# Patient Record
Sex: Male | Born: 1985
Health system: Southern US, Community
[De-identification: ages and names within clinical notes are randomized; demographics above are authoritative.]

## PROBLEM LIST (undated history)

## (undated) DIAGNOSIS — E785 Hyperlipidemia, unspecified: Secondary | ICD-10-CM

## (undated) DIAGNOSIS — R030 Elevated blood-pressure reading, without diagnosis of hypertension: Secondary | ICD-10-CM

## (undated) DIAGNOSIS — J40 Bronchitis, not specified as acute or chronic: Secondary | ICD-10-CM

## (undated) DIAGNOSIS — R0789 Other chest pain: Secondary | ICD-10-CM

## (undated) DIAGNOSIS — I10 Essential (primary) hypertension: Secondary | ICD-10-CM

## (undated) DIAGNOSIS — I499 Cardiac arrhythmia, unspecified: Secondary | ICD-10-CM

## (undated) DIAGNOSIS — Z8489 Family history of other specified conditions: Secondary | ICD-10-CM

## (undated) DIAGNOSIS — J329 Chronic sinusitis, unspecified: Secondary | ICD-10-CM

## (undated) DIAGNOSIS — I4891 Unspecified atrial fibrillation: Secondary | ICD-10-CM

## (undated) HISTORY — PX: CYST EXCISION: SHX5701

## (undated) HISTORY — DX: Hyperlipidemia, unspecified: E78.5

## (undated) HISTORY — DX: Chronic sinusitis, unspecified: J32.9

## (undated) HISTORY — DX: Unspecified atrial fibrillation: I48.91

## (undated) HISTORY — PX: OTHER SURGICAL HISTORY: SHX169

## (undated) HISTORY — PX: CARDIOVERSION: SHX1299

## (undated) HISTORY — DX: Bronchitis, not specified as acute or chronic: J40

## (undated) HISTORY — DX: Elevated blood-pressure reading, without diagnosis of hypertension: R03.0

## (undated) HISTORY — DX: Other chest pain: R07.89

---
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
## 2004-05-09 ENCOUNTER — Emergency Department (HOSPITAL_COMMUNITY): Admission: EM | Admit: 2004-05-09 | Discharge: 2004-05-09 | Payer: Self-pay | Admitting: Emergency Medicine

## 2010-12-22 ENCOUNTER — Ambulatory Visit (HOSPITAL_COMMUNITY)
Admission: RE | Admit: 2010-12-22 | Discharge: 2010-12-22 | Payer: Self-pay | Source: Home / Self Care | Attending: Family Medicine | Admitting: Family Medicine

## 2011-03-29 ENCOUNTER — Ambulatory Visit (INDEPENDENT_AMBULATORY_CARE_PROVIDER_SITE_OTHER): Payer: BC Managed Care – PPO | Admitting: Internal Medicine

## 2011-03-29 ENCOUNTER — Encounter: Payer: Self-pay | Admitting: Internal Medicine

## 2011-03-29 VITALS — BP 100/60 | HR 64 | Ht 79.0 in | Wt 202.0 lb

## 2011-03-29 DIAGNOSIS — R1011 Right upper quadrant pain: Secondary | ICD-10-CM | POA: Insufficient documentation

## 2011-03-29 MED ORDER — DICYCLOMINE HCL 20 MG PO TABS: ORAL_TABLET | ORAL | Status: DC

## 2011-03-29 NOTE — Progress Notes (Addendum)
  Subjective:    Patient ID: Todd Ford, male    DOB: 1986-08-31, 25 y.o.   MRN: [id number]  HPI 25 year old white man with a 2 to three-month history of right upper quadrant pain. It began in late 2011. There is a constant pressure there, and there are times where it increases. Sometimes this occurs after eating. He notes that defecation will relieve it partially but it's always there. It does not disturb his sleep. He said some mild irregular bowel habits with every other day and now moving his bowels twice a day but this isn't necessarily out of the ordinary. There is no bleeding or unintentional weight loss. He has not had problems like this before. Perhaps some mild nausea at one point but no vomiting. His workup has included an abdominal CT scan with contrast on 12/22/2010 that is normal. A CBC, metabolic profile including LFTs was also normal. Amylase normal as well. He tried Protonix but noted increased bloating and felt worse so stopped it. No recent gastroenteritis preceding this no chronic antibiotic use or anything. He had a prostatitis treated  He is employed as an Personnel officer. He works at the [employer]. 3 caffeinated beverages a day. No alcohol. Past medical history, surgical history, allergies, medications as well as family and social history reviewed and updated in the EMR. Review of Systems Comprehensive review of systems otherwise negative except as mentioned in history of present illness.    Objective:   Physical Exam Developed well-nourished young white man in no acute distress Eyes anicteric Neck supple no thyromegaly or mass Lungs clear Heart S1-S2 no rubs or gallops Abdomen is thin soft and nontender without organomegaly mass the region nontender as well Extremities free of edema No subclavicular or cervical adenopathy Psych appropriate mood and affect Skin no acute rash noted       Assessment & Plan:  See the problem list assessment and plan underwent  upper quadrant pain

## 2011-03-29 NOTE — Assessment & Plan Note (Signed)
He's had a several month history of this as of April 2012. CT abdomen and laboratory testing including a CMP, CBC and amylase are unrevealing. The symptoms are not disabling to her improved with defecation suggesting a cut oriented problem as opposed to the liver or gallbladder problems. The constant pressure sensation suggests that as well. I suspect this is functional, IBS-like problem. No weight loss or other warning signs. He will try dicyclomine 20 mg a half to 1 tab before meals and he will call back in 2 weeks with the results of this therapeutic trial. I explained other testing such as a gallbladder ultrasound could be indicated though his pain is not really biliary in nature it seems. Don't think he needs any sort of endoscopic workup based on the available information. Repeat laboratory testing could be indicated. That will depend upon the overall course.

## 2011-03-29 NOTE — Patient Instructions (Signed)
Your prescription(s) have been sent to you pharmacy.  Call back in 2 weeks to speak with Dr. Teresita Madura nurse with an update.

## 2011-04-01 ENCOUNTER — Encounter: Payer: Self-pay | Admitting: Internal Medicine

## 2011-04-14 ENCOUNTER — Telehealth: Payer: Self-pay | Admitting: Internal Medicine

## 2011-04-14 NOTE — Telephone Encounter (Signed)
Patient reports no improvement in abdominal pain after trial of dicyclomine.  Dr Leone Payor please advise next step.

## 2011-04-15 NOTE — Telephone Encounter (Signed)
Patient advised, he states he needs to call me back

## 2011-04-15 NOTE — Telephone Encounter (Signed)
Please have him stop the dicyclomine and order an abdominal ultrasound to evalute RUQ pain 789.01

## 2011-04-15 NOTE — Telephone Encounter (Signed)
Patient advised of the appt for [place] at [facility] for 04/21/11 11:00.  He is advised to be NPO after midnight.

## 2011-04-21 ENCOUNTER — Ambulatory Visit (HOSPITAL_COMMUNITY)
Admission: RE | Admit: 2011-04-21 | Discharge: 2011-04-21 | Disposition: A | Discharge status: Home / Self Care | Payer: BC Managed Care – PPO | Source: Ambulatory Visit | Attending: Internal Medicine | Admitting: Internal Medicine

## 2011-04-21 DIAGNOSIS — R1011 Right upper quadrant pain: Secondary | ICD-10-CM | POA: Insufficient documentation

## 2011-04-21 DIAGNOSIS — R110 Nausea: Secondary | ICD-10-CM | POA: Insufficient documentation

## 2011-04-21 NOTE — Progress Notes (Signed)
Quick Note:  This is ok No worrisome findings on any studies Unless there is new problem(s) i would like him to take align 1 qd x 1 month as bloating problems can be helped by a probiotic See me in 4-6 weeks to review things Let me know if any other ?'s ______

## 2013-02-20 ENCOUNTER — Ambulatory Visit (INDEPENDENT_AMBULATORY_CARE_PROVIDER_SITE_OTHER): Payer: BC Managed Care – PPO | Admitting: Surgery

## 2013-02-20 ENCOUNTER — Encounter (INDEPENDENT_AMBULATORY_CARE_PROVIDER_SITE_OTHER): Payer: Self-pay | Admitting: Surgery

## 2013-02-20 VITALS — BP 124/66 | HR 87 | Temp 97.1°F | Resp 16 | Ht 79.0 in | Wt 208.4 lb

## 2013-02-20 DIAGNOSIS — R1903 Right lower quadrant abdominal swelling, mass and lump: Secondary | ICD-10-CM

## 2013-02-20 DIAGNOSIS — R1909 Other intra-abdominal and pelvic swelling, mass and lump: Secondary | ICD-10-CM

## 2013-02-20 NOTE — Patient Instructions (Signed)
GENERAL SURGERY: POST OP INSTRUCTIONS ° °1. DIET: Follow a light bland diet the first 24 hours after arrival home, such as soup, liquids, crackers, etc.  Be sure to include lots of fluids daily.  Avoid fast food or heavy meals as your are more likely to get nauseated.   °2. Take your usually prescribed home medications unless otherwise directed. °3. PAIN CONTROL: °a. Pain is best controlled by a usual combination of three different methods TOGETHER: °i. Ice/Heat °ii. Over the counter pain medication °iii. Prescription pain medication °b. Most patients will experience some swelling and bruising around the incisions.  Ice packs or heating pads (30-60 minutes up to 6 times a day) will help. Use ice for the first few days to help decrease swelling and bruising, then switch to heat to help relax tight/sore spots and speed recovery.  Some people prefer to use ice alone, heat alone, alternating between ice & heat.  Experiment to what works for you.  Swelling and bruising can take several weeks to resolve.   °c. It is helpful to take an over-the-counter pain medication regularly for the first few weeks.  Choose one of the following that works best for you: °i. Naproxen (Aleve, etc)  Two 220mg tabs twice a day °ii. Ibuprofen (Advil, etc) Three 200mg tabs four times a day (every meal & bedtime) °iii. Acetaminophen (Tylenol, etc) 500-650mg four times a day (every meal & bedtime) °d. A  prescription for pain medication (such as oxycodone, hydrocodone, etc) should be given to you upon discharge.  Take your pain medication as prescribed.  °i. If you are having problems/concerns with the prescription medicine (does not control pain, nausea, vomiting, rash, itching, etc), please call us [phone] to see if we need to switch you to a different pain medicine that will work better for you and/or control your side effect better. °ii. If you need a refill on your pain medication, please contact your pharmacy.  They will contact our  office to request authorization. Prescriptions will not be filled after 5 pm or on week-ends. °4. Avoid getting constipated.  Between the surgery and the pain medications, it is common to experience some constipation.  Increasing fluid intake and taking a fiber supplement (such as Metamucil, Citrucel, FiberCon, MiraLax, etc) 1-2 times a day regularly will usually help prevent this problem from occurring.  A mild laxative (prune juice, Milk of Magnesia, MiraLax, etc) should be taken according to package directions if there are no bowel movements after 48 hours.   °5. Wash / shower every day.  You may shower over the dressings as they are waterproof.  Continue to shower over incision(s) after the dressing is off. °6. Remove your waterproof bandages 5 days after surgery.  You may leave the incision open to air.  You may have skin tapes (Steri Strips) covering the incision(s).  Leave them on until one week, then remove.  You may replace a dressing/Band-Aid to cover the incision for comfort if you wish.  ° ° ° ° °7. ACTIVITIES as tolerated:   °a. You may resume regular (light) daily activities beginning the next day--such as daily self-care, walking, climbing stairs--gradually increasing activities as tolerated.  If you can walk 30 minutes without difficulty, it is safe to try more intense activity such as jogging, treadmill, bicycling, low-impact aerobics, swimming, etc. °b. Save the most intensive and strenuous activity for last such as sit-ups, heavy lifting, contact sports, etc  Refrain from any heavy lifting or straining until you   are off narcotics for pain control.   °c. DO NOT PUSH THROUGH PAIN.  Let pain be your guide: If it hurts to do something, don't do it.  Pain is your body warning you to avoid that activity for another week until the pain goes down. °d. You may drive when you are no longer taking prescription pain medication, you can comfortably wear a seatbelt, and you can safely maneuver your car and  apply brakes. °e. You may have sexual intercourse when it is comfortable.  °8. FOLLOW UP in our office °a. Please call [facility] at [phone] to set up an appointment to see your surgeon in the office for a follow-up appointment approximately 2-3 weeks after your surgery. °b. Make sure that you call for this appointment the day you arrive home to insure a convenient appointment time. °9. IF YOU HAVE DISABILITY OR FAMILY LEAVE FORMS, BRING THEM TO THE OFFICE FOR PROCESSING.  DO NOT GIVE THEM TO YOUR DOCTOR. ° ° °WHEN TO CALL US [phone]: °1. Poor pain control °2. Reactions / problems with new medications (rash/itching, nausea, etc)  °3. Fever over 101.5 F (38.5 C) °4. Worsening swelling or bruising °5. Continued bleeding from incision. °6. Increased pain, redness, or drainage from the incision °7. Difficulty breathing / swallowing ° ° The clinic staff is available to answer your questions during regular business hours (8:30am-5pm).  Please don’t hesitate to call and ask to speak to one of our nurses for clinical concerns.  ° If you have a medical emergency, go to the nearest emergency room or call 911. ° A surgeon from [facility] is always on call at the hospitals ° ° °[facility] °[address], [address], [place], [place]  [address] ? °MAIN: [phone] ? TOLL FREE: [phone] ?  °FAX [phone] °[email or web address] ° °

## 2013-02-20 NOTE — Progress Notes (Signed)
Patient ID: Todd Ford, male   DOB: [birth year], 27 y.o.   MRN: [id number]  Chief Complaint  Patient presents with  . New Evaluation    eval RIH    HPI Todd Ford is a 27 y.o. male.  Patient sent at the request of Dr.Luking for a 1 cm mass located in his right groin above the inguinal canal along the belt line. He was noticed 3 months ago. It is tender and causing discomfort there is pain to cross this. No redness or drainage. HPI  History reviewed. No pertinent past medical history.  Past Surgical History  Procedure Laterality Date  . Arm surgery      right, bone spur    Family History  Problem Relation Age of Onset  . Lung cancer Paternal Grandfather     Social History History  Substance Use Topics  . Smoking status: Never Smoker   . Smokeless tobacco: Never Used  . Alcohol Use: Yes     Comment: rare    Allergies  Allergen Reactions  . Neosporin (Neomycin-Polymyxin-Gramicidin)     Wound never heals it gets infected  . Penicillins Rash    Current Outpatient Prescriptions  Medication Sig Dispense Refill  . loratadine (CLARITIN) 10 MG tablet Take 10 mg by mouth daily.        . Multiple Vitamin (ONE-A-DAY MENS PO) Take 1 tablet by mouth daily.         No current facility-administered medications for this visit.    Review of Systems Review of Systems  Constitutional: Negative for fever, chills and unexpected weight change.  HENT: Negative for hearing loss, congestion, sore throat, trouble swallowing and voice change.   Eyes: Negative for visual disturbance.  Respiratory: Negative for cough and wheezing.   Cardiovascular: Negative for chest pain, palpitations and leg swelling.  Gastrointestinal: Negative for nausea, vomiting, abdominal pain, diarrhea, constipation, blood in stool, abdominal distention, anal bleeding and rectal pain.  Genitourinary: Negative for hematuria and difficulty urinating.  Musculoskeletal: Negative for arthralgias.  Skin:  Negative for rash and wound.  Neurological: Negative for seizures, syncope, weakness and headaches.  Hematological: Negative for adenopathy. Does not bruise/bleed easily.  Psychiatric/Behavioral: Negative for confusion.    Blood pressure 124/66, pulse 87, temperature 97.1 F (36.2 C), temperature source Temporal, resp. rate 16, height 6\' 7"  (2.007 m), weight 208 lb 6.4 oz (94.53 kg), SpO2 98.00%.  Physical Exam Physical Exam  Constitutional: He appears well-developed and well-nourished.  HENT:  Head: Normocephalic and atraumatic.  Eyes: EOM are normal. Pupils are equal, round, and reactive to light.  Neck: Normal range of motion.  Cardiovascular: Normal rate and regular rhythm.   Pulmonary/Chest: Effort normal and breath sounds normal.  Abdominal: There is no tenderness. No hernia. Hernia confirmed negative in the right inguinal area and confirmed negative in the left inguinal area.    Musculoskeletal: Normal range of motion.  Neurological: He is alert.  Skin: Skin is warm and dry.  Psychiatric: He has a normal mood and affect. His behavior is normal. Judgment and thought content normal.      Assessment    1 cm right groin mass at the beltline    Plan    This is causing pain in the patient wishes to have it excised.The procedure has been discussed with the patient.  Alternative therapies have been discussed with the patient.  Operative risks include bleeding,  Infection,  Organ injury,  Nerve injury,  Blood vessel injury,  DVT,  Pulmonary embolism,  Death,  And possible reoperation.  Medical management risks include worsening of present situation.  The success of the procedure is 50 -90 % at treating patients symptoms.  The patient understands and agrees to proceed.       Jarvin Ogren A. 02/20/2013, 10:15 AM

## 2013-03-08 ENCOUNTER — Other Ambulatory Visit (INDEPENDENT_AMBULATORY_CARE_PROVIDER_SITE_OTHER): Payer: Self-pay | Admitting: Surgery

## 2013-03-08 DIAGNOSIS — D1739 Benign lipomatous neoplasm of skin and subcutaneous tissue of other sites: Secondary | ICD-10-CM

## 2013-03-26 ENCOUNTER — Ambulatory Visit (INDEPENDENT_AMBULATORY_CARE_PROVIDER_SITE_OTHER): Payer: BC Managed Care – PPO | Admitting: Surgery

## 2013-03-26 ENCOUNTER — Encounter (INDEPENDENT_AMBULATORY_CARE_PROVIDER_SITE_OTHER): Payer: Self-pay | Admitting: Surgery

## 2013-03-26 VITALS — BP 128/72 | HR 83 | Temp 97.3°F | Resp 18 | Ht 79.0 in | Wt 203.4 lb

## 2013-03-26 DIAGNOSIS — Z9889 Other specified postprocedural states: Secondary | ICD-10-CM

## 2013-03-26 NOTE — Progress Notes (Signed)
Patient returns after excision of right lower quadrant mass from abdominal wall. This was an angiolipoma.  Exam: Right lower quadrant incision clean dry and intact.  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 Resume full activity.

## 2013-03-26 NOTE — Patient Instructions (Signed)
Return as needed

## 2013-08-18 ENCOUNTER — Encounter: Payer: Self-pay | Admitting: *Deleted

## 2013-08-23 ENCOUNTER — Encounter: Payer: Self-pay | Admitting: Family Medicine

## 2013-08-23 ENCOUNTER — Ambulatory Visit (INDEPENDENT_AMBULATORY_CARE_PROVIDER_SITE_OTHER): Payer: BC Managed Care – PPO | Admitting: Family Medicine

## 2013-08-23 VITALS — BP 126/70 | Ht 79.0 in | Wt 207.0 lb

## 2013-08-23 DIAGNOSIS — M76892 Other specified enthesopathies of left lower limb, excluding foot: Secondary | ICD-10-CM

## 2013-08-23 DIAGNOSIS — M6580 Other synovitis and tenosynovitis, unspecified site: Secondary | ICD-10-CM

## 2013-08-23 MED ORDER — MELOXICAM 15 MG PO TABS: 15.0000 mg | ORAL_TABLET | Freq: Every day | ORAL | Status: DC

## 2013-08-23 NOTE — Progress Notes (Signed)
  Subjective:    Patient ID: Todd Ford, male    DOB: [birth year], 27 y.o.   MRN: [id number]  Knee Pain  The incident occurred more than 1 week ago. There was no injury mechanism. The pain is present in the left knee. The quality of the pain is described as aching. The pain is at a severity of 10/10. The pain is severe. The pain has been intermittent since onset. He reports no foreign bodies present. Nothing aggravates the symptoms. He has tried NSAIDs and acetaminophen for the symptoms. The treatment provided no relief.  Hip Pain  The incident occurred more than 1 week ago. There was no injury mechanism. The pain is present in the right hip. The quality of the pain is described as aching. The pain is at a severity of 5/10. The pain is moderate. The pain has been intermittent since onset. He reports no foreign bodies present. Nothing aggravates the symptoms. He has tried acetaminophen and NSAIDs for the symptoms. The treatment provided no relief.   During 15k, pain worse going downhill, did not lock Patient not doing a proper job of training and exercising for such a long event. I advised the patient to gradually work his way up on the eyelids to help. Review of Systems    see above Objective:   Physical Exam Low back nontender he has tight hamstrings. Some limited range of motion with the hips. Knee ligaments stable no sign of any cartilage damage. Thigh and calf are normal.       Assessment & Plan:  Orthopedic pain related to overuse-proper training discuss icing after activity Mobic when necessary 15 mg 1 per day, followup if ongoing 20 minutes spent with patient

## 2013-11-06 ENCOUNTER — Ambulatory Visit: Payer: BC Managed Care – PPO | Admitting: Family Medicine

## 2013-11-07 ENCOUNTER — Ambulatory Visit (INDEPENDENT_AMBULATORY_CARE_PROVIDER_SITE_OTHER): Payer: BC Managed Care – PPO | Admitting: Family Medicine

## 2013-11-07 ENCOUNTER — Encounter: Payer: Self-pay | Admitting: Family Medicine

## 2013-11-07 VITALS — BP 100/70 | Temp 98.2°F | Ht 79.0 in | Wt 203.5 lb

## 2013-11-07 DIAGNOSIS — J329 Chronic sinusitis, unspecified: Secondary | ICD-10-CM

## 2013-11-07 MED ORDER — LEVOFLOXACIN 500 MG PO TABS: 500.0000 mg | ORAL_TABLET | Freq: Every day | ORAL | Status: AC

## 2013-11-07 NOTE — Progress Notes (Signed)
   Subjective:    Patient ID: Todd Ford, male    DOB: [birth year], 27 y.o.   MRN: [id number]  Sinusitis This is a new problem. The current episode started in the past 7 days. The problem has been gradually worsening since onset. Maximum temperature: 99. His pain is at a severity of 6/10. The pain is moderate. Associated symptoms include congestion, coughing, sinus pressure and a sore throat. Past treatments include nothing. The treatment provided no relief.   Nose running for a few weeks. Just runny. Last four days clogged up, congested  Some coughing, no headach, prod cough  Nonsmoker  Low gr fever   Moving a little into chest, gets some fall alergiw   Review of Systems  HENT: Positive for congestion, sinus pressure and sore throat.   Respiratory: Positive for cough.    No vomiting no diarrhea no rash ROS otherwise negative    Objective:   Physical Exam  Alert hydration good. HEENT moderate nasal congestion. Frontal tenderness. Pharynx normal. Neck supple. Lungs clear. Heart regular in rhythm.     Assessment & Plan:  Impression 1 rhinosinusitis discussed plan Levaquin daily 10 days. Symptomatic care discussed. WSL

## 2013-11-07 NOTE — Patient Instructions (Signed)
Consider adding otc afrin spray at night next 4 or 5 days

## 2013-12-28 ENCOUNTER — Encounter: Payer: Self-pay | Admitting: Family Medicine

## 2013-12-28 ENCOUNTER — Ambulatory Visit (INDEPENDENT_AMBULATORY_CARE_PROVIDER_SITE_OTHER): Payer: BC Managed Care – PPO | Admitting: Family Medicine

## 2013-12-28 VITALS — BP 128/82 | Ht 79.0 in | Wt 206.6 lb

## 2013-12-28 DIAGNOSIS — J019 Acute sinusitis, unspecified: Secondary | ICD-10-CM

## 2013-12-28 MED ORDER — LEVOFLOXACIN 500 MG PO TABS: 500.0000 mg | ORAL_TABLET | Freq: Every day | ORAL | Status: DC

## 2013-12-28 NOTE — Progress Notes (Signed)
   Subjective:    Patient ID: Todd Ford, male    DOB: 1985/12/12, 28 y.o.   MRN: [id number]  Sinus Problem This is a new problem. The current episode started in the past 7 days. Associated symptoms include chills, congestion, coughing and headaches. Pertinent negatives include no ear pain.  started Monday this week. On Tues felt worse, felt DOE T W TH also throat pain ans sinus pain Myalgias- a little  Last week with sweats and chills at night.   PMH benign  Review of Systems  Constitutional: Positive for chills. Negative for fever and activity change.  HENT: Positive for congestion and rhinorrhea. Negative for ear pain.   Eyes: Negative for discharge.  Respiratory: Positive for cough. Negative for wheezing.   Cardiovascular: Negative for chest pain.  Gastrointestinal: Positive for nausea and diarrhea. Negative for abdominal pain.  Neurological: Positive for headaches.       Objective:   Physical Exam  Nursing note and vitals reviewed. Constitutional: He appears well-developed.  HENT:  Head: Normocephalic.  Mouth/Throat: Oropharynx is clear and moist. No oropharyngeal exudate.  Neck: Normal range of motion.  Cardiovascular: Normal rate, regular rhythm and normal heart sounds.   No murmur heard. Pulmonary/Chest: Effort normal and breath sounds normal. He has no wheezes.  Lymphadenopathy:    He has no cervical adenopathy.  Neurological: He exhibits normal muscle tone.  Skin: Skin is warm and dry.          Assessment & Plan:  Viral syndrome started last week progressed into a mild sinus infection antibiotics prescribed warning signs discussed if progressive troubles notify us.

## 2014-02-27 ENCOUNTER — Ambulatory Visit: Payer: BC Managed Care – PPO | Admitting: Family Medicine

## 2014-08-20 ENCOUNTER — Ambulatory Visit (INDEPENDENT_AMBULATORY_CARE_PROVIDER_SITE_OTHER): Payer: BC Managed Care – PPO | Admitting: Family Medicine

## 2014-08-20 ENCOUNTER — Encounter: Payer: Self-pay | Admitting: Family Medicine

## 2014-08-20 VITALS — BP 100/70 | Ht 79.0 in | Wt 200.4 lb

## 2014-08-20 DIAGNOSIS — R61 Generalized hyperhidrosis: Secondary | ICD-10-CM

## 2014-08-20 LAB — BASIC METABOLIC PANEL
BUN: 12 mg/dL (ref 6–23)
CO2: 32 mEq/L (ref 19–32)
Calcium: 9.7 mg/dL (ref 8.4–10.5)
Chloride: 101 mEq/L (ref 96–112)
Creat: 0.93 mg/dL (ref 0.50–1.35)
Glucose, Bld: 82 mg/dL (ref 70–99)
Potassium: 4 mEq/L (ref 3.5–5.3)
Sodium: 138 mEq/L (ref 135–145)

## 2014-08-20 LAB — CBC WITH DIFFERENTIAL/PLATELET
Basophils Absolute: 0.1 10*3/uL (ref 0.0–0.1)
Basophils Relative: 1 % (ref 0–1)
Eosinophils Absolute: 0.3 10*3/uL (ref 0.0–0.7)
Eosinophils Relative: 5 % (ref 0–5)
HCT: 41.1 % (ref 39.0–52.0)
Hemoglobin: 14.7 g/dL (ref 13.0–17.0)
Lymphocytes Relative: 34 % (ref 12–46)
Lymphs Abs: 1.8 10*3/uL (ref 0.7–4.0)
MCH: 30.8 pg (ref 26.0–34.0)
MCHC: 35.8 g/dL (ref 30.0–36.0)
MCV: 86.2 fL (ref 78.0–100.0)
Monocytes Absolute: 0.6 10*3/uL (ref 0.1–1.0)
Monocytes Relative: 12 % (ref 3–12)
Neutro Abs: 2.5 10*3/uL (ref 1.7–7.7)
Neutrophils Relative %: 48 % (ref 43–77)
Platelets: 234 10*3/uL (ref 150–400)
RBC: 4.77 MIL/uL (ref 4.22–5.81)
RDW: 13.6 % (ref 11.5–15.5)
WBC: 5.3 10*3/uL (ref 4.0–10.5)

## 2014-08-20 LAB — HEPATIC FUNCTION PANEL
ALT: 14 U/L (ref 0–53)
AST: 17 U/L (ref 0–37)
Albumin: 4.5 g/dL (ref 3.5–5.2)
Alkaline Phosphatase: 69 U/L (ref 39–117)
Bilirubin, Direct: 0.1 mg/dL (ref 0.0–0.3)
Indirect Bilirubin: 0.5 mg/dL (ref 0.2–1.2)
Total Bilirubin: 0.6 mg/dL (ref 0.2–1.2)
Total Protein: 7.2 g/dL (ref 6.0–8.3)

## 2014-08-20 LAB — SEDIMENTATION RATE: Sed Rate: 1 mm/hr (ref 0–16)

## 2014-08-20 MED ORDER — LORAZEPAM 1 MG PO TABS: ORAL_TABLET | ORAL | Status: DC

## 2014-08-20 NOTE — Patient Instructions (Signed)
Please keep a journal  Of symptoms and temps. Please send it to me in 3 to 4 weeks and I will call you.

## 2014-08-20 NOTE — Progress Notes (Signed)
   Subjective:    Patient ID: Todd Ford, male    DOB: Apr 20, 1986, 28 y.o.   MRN: [id number]  HPI Patient is here today because he has not been able to sleep at night and having very bad night sweats. This has been present for 1 week now.  Patient states that he has had sinus trouble for over 1 month now. Bloody congestion, dryness noted.   Patient states he has no other concerns at this time.  Unfortunately his job recently he was told they will not have a job after one year from now Mrs. cause significant stress and this is when all these and then started  Review of Systems  Constitutional: Negative for fever, activity change, appetite change and fatigue.  HENT: Positive for congestion and rhinorrhea. Negative for ear pain.   Eyes: Negative for discharge.  Respiratory: Positive for cough. Negative for wheezing.   Cardiovascular: Negative for chest pain.  Gastrointestinal: Negative for abdominal pain, diarrhea and blood in stool.  Endocrine: Negative for polydipsia and polyphagia.  Genitourinary: Negative for hematuria.  Skin: Negative for color change.  Neurological: Negative for weakness.  Hematological: Negative for adenopathy.  Psychiatric/Behavioral: Positive for sleep disturbance. Negative for confusion.       Objective:   Physical Exam  Vitals reviewed. Constitutional: He appears well-nourished. No distress.  Cardiovascular: Normal rate, regular rhythm and normal heart sounds.   No murmur heard. Pulmonary/Chest: Effort normal and breath sounds normal. No respiratory distress.  Musculoskeletal: He exhibits no edema.  Lymphadenopathy:    He has no cervical adenopathy.  Neurological: He is alert.  Psychiatric: His behavior is normal.   No masses were noted, no lymphadenopathy Small anal tear No abnl skin lesions       Assessment & Plan:  Night sweats Labs Journal Lab work was ordered. Await the findings of these. I do not feel the patient needs x-ray her  chest CT or abdominal CT currently but I do highly recommend that he bring his journal in a few weeks' time if he is having ongoing night sweats he will need these intervention  Ativan may be used at nighttime when he has insomnia. Under a lot of stress denies being depressed denies being suicidal

## 2014-11-01 ENCOUNTER — Encounter: Payer: Self-pay | Admitting: Family Medicine

## 2014-11-01 ENCOUNTER — Ambulatory Visit (INDEPENDENT_AMBULATORY_CARE_PROVIDER_SITE_OTHER): Payer: BC Managed Care – PPO | Admitting: Family Medicine

## 2014-11-01 VITALS — BP 118/74 | Temp 98.4°F | Ht 79.0 in | Wt 215.0 lb

## 2014-11-01 DIAGNOSIS — J329 Chronic sinusitis, unspecified: Secondary | ICD-10-CM

## 2014-11-01 MED ORDER — CLARITHROMYCIN 500 MG PO TABS: 500.0000 mg | ORAL_TABLET | Freq: Two times a day (BID) | ORAL | Status: AC

## 2014-11-01 NOTE — Progress Notes (Signed)
   Subjective:    Patient ID: Todd Ford, male    DOB: 07-Dec-1985, 28 y.o.   MRN: [id number]  Cough This is a new problem. Episode onset: Monday. The problem has been gradually worsening. The cough is productive of sputum. Associated symptoms include nasal congestion, postnasal drip and a sore throat. The symptoms are aggravated by lying down. He has tried OTC cough suppressant (Mucinex) for the symptoms. The treatment provided mild relief.    Stuffy and drainag trouble breat  Into chest does not smoke  Pos prod cough  Gunky at times  Ears doable  Review of Systems  HENT: Positive for postnasal drip and sore throat.   Respiratory: Positive for cough.    no vomiting no diarrhea     Objective:   Physical Exam   Alert hydration good. Vitals stable. No acute distress. Frontal maxillary fullness congestion. Stuffiness and tenderness. Pharynx normal neck supple lungs clear. Heart regular in rhythm.     Assessment & Plan:  Impression post viral rhinosinusitis plan antibiotics prescribed. Symptomatic care discussed. Warning signs discussed. WSL

## 2016-04-01 ENCOUNTER — Ambulatory Visit (INDEPENDENT_AMBULATORY_CARE_PROVIDER_SITE_OTHER): Payer: BLUE CROSS/BLUE SHIELD | Admitting: Family Medicine

## 2016-04-01 ENCOUNTER — Encounter: Payer: Self-pay | Admitting: Family Medicine

## 2016-04-01 VITALS — BP 120/76 | Temp 97.8°F | Ht 79.0 in | Wt 221.2 lb

## 2016-04-01 DIAGNOSIS — R229 Localized swelling, mass and lump, unspecified: Secondary | ICD-10-CM | POA: Diagnosis not present

## 2016-04-01 DIAGNOSIS — R222 Localized swelling, mass and lump, trunk: Secondary | ICD-10-CM

## 2016-04-01 MED ORDER — DOXYCYCLINE HYCLATE 100 MG PO TABS: 100.0000 mg | ORAL_TABLET | Freq: Two times a day (BID) | ORAL | Status: DC

## 2016-04-01 NOTE — Progress Notes (Signed)
   Subjective:    Patient ID: Todd Ford, male    DOB: 1986-02-16, 30 y.o.   MRN: [id number]  HPI Patient is here today because he has a swollen painful spot on his chest. Onset about 1 month ago. Treatments tried: none.   He noticed this about a month ago with a swollen tender area then he states that the swelling seemed to go down low bit but he is been left with what appears to be a growth underneath the nipple with the low but a firmness to it some soreness and pain as well no redness or drainage. No fever chills or sweats. Patient has no other concerns at this time.  Review of Systems    see above Objective:   Physical Exam Lungs are clear no crackles chest wall there is some tenderness in the left nipple region no firmness but there does appear to be a nodule underneath the nipple. 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 Possibility patient may need removal of this to make sure he is not developing into a cancer situation. I did go ahead and treat with a round of antibiotics just encases could be infection related

## 2016-04-01 NOTE — Addendum Note (Signed)
Addended by: Carmelina Noun on: 04/01/2016 01:59 PM   Modules accepted: Orders

## 2016-04-16 ENCOUNTER — Ambulatory Visit
Admission: RE | Admit: 2016-04-16 | Discharge: 2016-04-16 | Disposition: A | Discharge status: Home / Self Care | Payer: BLUE CROSS/BLUE SHIELD | Source: Ambulatory Visit | Attending: General Surgery | Admitting: General Surgery

## 2016-04-16 ENCOUNTER — Other Ambulatory Visit: Payer: Self-pay | Admitting: General Surgery

## 2016-04-16 DIAGNOSIS — N62 Hypertrophy of breast: Secondary | ICD-10-CM | POA: Diagnosis not present

## 2016-04-16 DIAGNOSIS — N644 Mastodynia: Secondary | ICD-10-CM | POA: Diagnosis not present

## 2016-04-16 DIAGNOSIS — D171 Benign lipomatous neoplasm of skin and subcutaneous tissue of trunk: Secondary | ICD-10-CM | POA: Diagnosis not present

## 2016-09-03 ENCOUNTER — Encounter: Payer: Self-pay | Admitting: Nurse Practitioner

## 2016-09-03 ENCOUNTER — Ambulatory Visit (INDEPENDENT_AMBULATORY_CARE_PROVIDER_SITE_OTHER): Payer: BLUE CROSS/BLUE SHIELD | Admitting: Nurse Practitioner

## 2016-09-03 VITALS — BP 124/82 | Temp 98.0°F | Ht 79.0 in | Wt 222.4 lb

## 2016-09-03 DIAGNOSIS — J329 Chronic sinusitis, unspecified: Secondary | ICD-10-CM | POA: Diagnosis not present

## 2016-09-03 DIAGNOSIS — Z79899 Other long term (current) drug therapy: Secondary | ICD-10-CM | POA: Diagnosis not present

## 2016-09-03 DIAGNOSIS — Z13220 Encounter for screening for lipoid disorders: Secondary | ICD-10-CM

## 2016-09-03 MED ORDER — AZITHROMYCIN 250 MG PO TABS: ORAL_TABLET | ORAL | 0 refills | Status: DC

## 2016-09-03 NOTE — Patient Instructions (Signed)
Nasacort, Flonase or Rhinocort as directed daily

## 2016-09-04 ENCOUNTER — Encounter: Payer: Self-pay | Admitting: Nurse Practitioner

## 2016-09-04 NOTE — Progress Notes (Signed)
Subjective:  Presents for c/o pressure around the head with minimal headache. States he feels like "water is feeling his head for the past few days. Off-and-on mild dizziness occurring with sudden movement. Slight sore throat. Had some clear liquid draining from left ear yesterday, this has resolved. No ear pain. No fever. No cough. Right-sided ear pressure for the past 2 days. No numbness or weakness of the face arms or legs. No difficulty speaking or swallowing. No visual changes.  Objective:   BP 124/82   Temp 98 F (36.7 C) (Oral)   Ht 6\' 7"  (2.007 m)   Wt 222 lb 6 oz (100.9 kg)   BMI 25.05 kg/m  NAD. Alert, oriented. Right TM TM retracted with mild injection. Left TM minimal clear effusion. Nasal mucosa pale and boggy. Pharynx injected with green PND noted. Neck supple with mild soft anterior adenopathy. Lungs clear. Heart regular rate rhythm. Pupils equal and reactive to light. EOMs intact without nystagmus. Point-to-point localization normal limit. Reflexes normal limit. Romberg negative.  Assessment: Rhinosinusitis  Screening cholesterol level - Plan: Lipid panel  High risk medication use - Plan: Hepatic function panel, Basic metabolic panel  Plan:  Meds ordered this encounter  Medications  . azithromycin (ZITHROMAX Z-PAK) 250 MG tablet    Sig: Take 2 tablets (500 mg) on  Day 1,  followed by 1 tablet (250 mg) once daily on Days 2 through 5.    Dispense:  6 each    Refill:  0    Order Specific Question:   Supervising Provider    Answer:   Mikey Kirschner [[id number]]   OTC antihistamine and steroid nasal spray. Mild vertigo most likely related to in her ear dysfunction. Warning signs reviewed. Call back if symptoms worsen or persist. Routine lab work ordered today for worksite health promotion.

## 2016-09-17 ENCOUNTER — Encounter: Payer: Self-pay | Admitting: Family Medicine

## 2016-09-17 ENCOUNTER — Ambulatory Visit (INDEPENDENT_AMBULATORY_CARE_PROVIDER_SITE_OTHER): Payer: BLUE CROSS/BLUE SHIELD | Admitting: Family Medicine

## 2016-09-17 VITALS — BP 110/78 | Ht 79.0 in | Wt 222.4 lb

## 2016-09-17 DIAGNOSIS — Z79899 Other long term (current) drug therapy: Secondary | ICD-10-CM | POA: Diagnosis not present

## 2016-09-17 DIAGNOSIS — Z13220 Encounter for screening for lipoid disorders: Secondary | ICD-10-CM | POA: Diagnosis not present

## 2016-09-17 DIAGNOSIS — Z0000 Encounter for general adult medical examination without abnormal findings: Secondary | ICD-10-CM | POA: Diagnosis not present

## 2016-09-17 NOTE — Progress Notes (Signed)
   Subjective:    Patient ID: Todd Ford, male    DOB: [birth year], 30 y.o.   MRN: [id number]  HPI The patient comes in today for a wellness visit.    A review of their health history was completed.  A review of medications was also completed.  Any needed refills; N/A  Eating habits: good  Falls/  MVA accidents in past few months: none  Regular exercise: good  Specialist pt sees on regular basis: none  Preventative health issues were discussed.   Additional concerns: none   Review of Systems  Constitutional: Negative for activity change, appetite change and fever.  HENT: Negative for congestion and rhinorrhea.   Eyes: Negative for discharge.  Respiratory: Negative for cough and wheezing.   Cardiovascular: Negative for chest pain.  Gastrointestinal: Negative for abdominal pain, blood in stool and vomiting.  Genitourinary: Negative for difficulty urinating and frequency.  Musculoskeletal: Negative for neck pain.  Skin: Negative for rash.  Allergic/Immunologic: Negative for environmental allergies and food allergies.  Neurological: Negative for weakness and headaches.  Psychiatric/Behavioral: Negative for agitation.       Objective:   Physical Exam  Constitutional: He appears well-developed and well-nourished.  HENT:  Head: Normocephalic and atraumatic.  Right Ear: External ear normal.  Left Ear: External ear normal.  Nose: Nose normal.  Mouth/Throat: Oropharynx is clear and moist.  Eyes: EOM are normal. Pupils are equal, round, and reactive to light.  Neck: Normal range of motion. Neck supple. No thyromegaly present.  Cardiovascular: Normal rate, regular rhythm and normal heart sounds.   No murmur heard. Pulmonary/Chest: Effort normal and breath sounds normal. No respiratory distress. He has no wheezes.  Abdominal: Soft. Bowel sounds are normal. He exhibits no distension and no mass. There is no tenderness.  Genitourinary: Penis normal.  Musculoskeletal:  Normal range of motion. He exhibits no edema.  Lymphadenopathy:    He has no cervical adenopathy.  Neurological: He is alert. He exhibits normal muscle tone.  Skin: Skin is warm and dry. No erythema.  Psychiatric: He has a normal mood and affect. His behavior is normal. Judgment normal.          Assessment & Plan:  Adult wellness-complete.wellness physical was conducted today. Importance of diet and exercise were discussed in detail. In addition to this a discussion regarding safety was also covered. We also reviewed over immunizations and gave recommendations regarding current immunization needed for age. In addition to this additional areas were also touched on including: Preventative health exams needed: Colonoscopy Colonoscopy not indicated currently Safety dietary discussed in detail patient does exercise on a regular basis does not smoke or drink.  Patient was advised yearly wellness exam

## 2016-09-18 LAB — BASIC METABOLIC PANEL
BUN/Creatinine Ratio: 12 (ref 9–20)
BUN: 12 mg/dL (ref 6–20)
CO2: 28 mmol/L (ref 18–29)
Calcium: 9.5 mg/dL (ref 8.7–10.2)
Chloride: 100 mmol/L (ref 96–106)
Creatinine, Ser: 0.97 mg/dL (ref 0.76–1.27)
GFR calc Af Amer: 119 mL/min/{1.73_m2} (ref >59)
GFR calc non Af Amer: 103 mL/min/{1.73_m2} (ref >59)
Glucose: 88 mg/dL (ref 65–99)
Potassium: 4.5 mmol/L (ref 3.5–5.2)
Sodium: 142 mmol/L (ref 134–144)

## 2016-09-18 LAB — HEPATIC FUNCTION PANEL
ALT: 42 IU/L (ref 0–44)
AST: 28 IU/L (ref 0–40)
Albumin: 4.6 g/dL (ref 3.5–5.5)
Alkaline Phosphatase: 78 IU/L (ref 39–117)
Bilirubin Total: 0.7 mg/dL (ref 0.0–1.2)
Bilirubin, Direct: 0.16 mg/dL (ref 0.00–0.40)
Total Protein: 7.2 g/dL (ref 6.0–8.5)

## 2016-09-18 LAB — LIPID PANEL
Chol/HDL Ratio: 4.4 ratio units (ref 0.0–5.0)
Cholesterol, Total: 159 mg/dL (ref 100–199)
HDL: 36 mg/dL — ABNORMAL LOW (ref >39)
LDL Calculated: 107 mg/dL — ABNORMAL HIGH (ref 0–99)
Triglycerides: 78 mg/dL (ref 0–149)
VLDL Cholesterol Cal: 16 mg/dL (ref 5–40)

## 2016-09-24 ENCOUNTER — Telehealth: Payer: Self-pay | Admitting: Nurse Practitioner

## 2016-09-24 NOTE — Telephone Encounter (Signed)
ERROR

## 2016-09-27 ENCOUNTER — Encounter: Payer: Self-pay | Admitting: Nurse Practitioner

## 2017-02-07 ENCOUNTER — Encounter (HOSPITAL_COMMUNITY): Payer: Self-pay | Admitting: *Deleted

## 2017-02-07 ENCOUNTER — Emergency Department (HOSPITAL_COMMUNITY): Payer: BLUE CROSS/BLUE SHIELD

## 2017-02-07 ENCOUNTER — Emergency Department (HOSPITAL_COMMUNITY)
Admission: EM | Admit: 2017-02-07 | Discharge: 2017-02-08 | Disposition: A | Discharge status: Home / Self Care | Payer: BLUE CROSS/BLUE SHIELD | Attending: Emergency Medicine | Admitting: Emergency Medicine

## 2017-02-07 DIAGNOSIS — R000 Tachycardia, unspecified: Secondary | ICD-10-CM | POA: Diagnosis not present

## 2017-02-07 DIAGNOSIS — R0602 Shortness of breath: Secondary | ICD-10-CM | POA: Diagnosis not present

## 2017-02-07 DIAGNOSIS — Z79899 Other long term (current) drug therapy: Secondary | ICD-10-CM | POA: Diagnosis not present

## 2017-02-07 DIAGNOSIS — I4891 Unspecified atrial fibrillation: Secondary | ICD-10-CM

## 2017-02-07 HISTORY — PX: CARDIOVERSION: SHX1299

## 2017-02-07 HISTORY — DX: Unspecified atrial fibrillation: I48.91

## 2017-02-07 LAB — CBC WITH DIFFERENTIAL/PLATELET
Basophils Absolute: 0 10*3/uL (ref 0.0–0.1)
Basophils Relative: 1 %
Eosinophils Absolute: 0.4 10*3/uL (ref 0.0–0.7)
Eosinophils Relative: 5 %
HCT: 44.6 % (ref 39.0–52.0)
Hemoglobin: 16.1 g/dL (ref 13.0–17.0)
Lymphocytes Relative: 37 %
Lymphs Abs: 2.6 10*3/uL (ref 0.7–4.0)
MCH: 31.2 pg (ref 26.0–34.0)
MCHC: 36.1 g/dL — ABNORMAL HIGH (ref 30.0–36.0)
MCV: 86.4 fL (ref 78.0–100.0)
Monocytes Absolute: 0.7 10*3/uL (ref 0.1–1.0)
Monocytes Relative: 10 %
Neutro Abs: 3.5 10*3/uL (ref 1.7–7.7)
Neutrophils Relative %: 47 %
Platelets: 239 10*3/uL (ref 150–400)
RBC: 5.16 MIL/uL (ref 4.22–5.81)
RDW: 12.5 % (ref 11.5–15.5)
WBC: 7.2 10*3/uL (ref 4.0–10.5)

## 2017-02-07 LAB — I-STAT TROPONIN, ED: Troponin i, poc: 0 ng/mL (ref 0.00–0.08)

## 2017-02-07 MED ORDER — SODIUM CHLORIDE 0.9 % IV BOLUS (SEPSIS)
1000.0000 mL | Freq: Once | INTRAVENOUS | Status: AC
  Administered 2017-02-07: 1000 mL via INTRAVENOUS

## 2017-02-07 MED ORDER — PROPOFOL 10 MG/ML IV BOLUS
0.5000 mg/kg | Freq: Once | INTRAVENOUS | Status: DC
  Filled 2017-02-07: qty 20

## 2017-02-07 MED ORDER — DILTIAZEM HCL 25 MG/5ML IV SOLN
10.0000 mg | Freq: Once | INTRAVENOUS | Status: DC
  Filled 2017-02-07: qty 5

## 2017-02-07 MED ORDER — PROPOFOL 10 MG/ML IV BOLUS
1.0000 mg/kg | Freq: Once | INTRAVENOUS | Status: DC
Start: 2017-02-08 — End: 2017-02-08

## 2017-02-07 NOTE — ED Notes (Signed)
Dr. Ward at the bedside.  

## 2017-02-07 NOTE — ED Provider Notes (Addendum)
TIME SEEN: 11:15 PM  CHIEF COMPLAINT: Tachycardia By signing my name below, I, Ethelle Lyon Long, attest that this documentation has been prepared under the direction and in the presence of Crow Agency, DO . Electronically Signed: Ethelle Lyon Long, Scribe. 02/07/2017. 11:30 PM.   HPI: HPI Comments:  Todd Ford is a 31 y.o. male with no pertinent PMHx, brought in by ambulance, who presents to the Emergency Department complaining of sudden onset, palpitations beginning an hour and a half ago around 9:30 pm. Pt reports while watching tv and eating cereal this evening, the sudden onset palpitations arose. He states it feels like "I just ran a marathon". He has never had anything like this happen before. He has associated symptoms of SOB and mild dizziness and states his ears feel "cold". Pt is not currently on anticoagulant or antiplatelet therapy. Pt denies a decreased change in caffeine consumption in his diet, reducing to one soda a day. No illicit drug use or OTC medications stated including over-the-counter stimulants. No history of PE, DVT, exogenous estrogen use, recent fractures, surgery, trauma, hospitalization or prolonged travel. No lower extremity swelling or pain. No calf tenderness. No alleviating factors noted. Last stated meal was the bowel of cereal with a water bottle at 9:30pm. Pt denies fever, cough, vomiting, diarrhea, and any other associated symptoms at this time.  No chest pain or chest discomfort.  No history of arrhythmia.    ROS: See HPI Constitutional: no fever  Eyes: no drainage  ENT: no runny nose   Cardiovascular:  no chest pain  Resp: mild SOB  GI: no vomiting GU: no dysuria Integumentary: no rash  Allergy: no hives  Musculoskeletal: no leg swelling  Neurological: no slurred speech, mild dizziness.  ROS otherwise negative  PAST MEDICAL HISTORY/PAST SURGICAL HISTORY:  History reviewed. No pertinent past medical history.  MEDICATIONS:  Prior to  Admission medications   Medication Sig Start Date End Date Taking? Authorizing Provider  loratadine (CLARITIN) 10 MG tablet Take 10 mg by mouth daily.      Historical Provider, MD  Multiple Vitamin (ONE-A-DAY MENS PO) Take 1 tablet by mouth daily.      Historical Provider, MD    ALLERGIES:  Allergies  Allergen Reactions  . Neosporin [Neomycin-Polymyxin-Gramicidin]     Wound never heals it gets infected  . Penicillins Rash    SOCIAL HISTORY:  Social History  Substance Use Topics  . Smoking status: Never Smoker  . Smokeless tobacco: Never Used  . Alcohol use Yes     Comment: rare    FAMILY HISTORY: Family History  Problem Relation Age of Onset  . Lung cancer Paternal Grandfather     EXAM: BP 121/81 (BP Location: Left Arm)   Pulse (!) 148   Temp 98.2 F (36.8 C) (Oral)   Resp 16   Ht 6\' 7"  (2.007 m)   Wt 220 lb (99.8 kg)   SpO2 100%   BMI 24.78 kg/m  CONSTITUTIONAL: Alert and oriented and responds appropriately to questions. Well-appearing; well-nourished, In no distress HEAD: Normocephalic EYES: Conjunctivae clear, pupils appear equal, EOMI ENT: normal nose; moist mucous membranes NECK: Supple, no meningismus, no nuchal rigidity, no LAD  CARD: S1 and S2 appreciated; no murmurs, no clicks, no rubs, no gallops. Irregularly regular rhythm and tachycardic.  RESP: Normal chest excursion without splinting or tachypnea; breath sounds clear and equal bilaterally; no wheezes, no rhonchi, no rales, no hypoxia or respiratory distress, speaking full sentences ABD/GI: Normal bowel sounds; non-distended; soft,  non-tender, no rebound, no guarding, no peritoneal signs, no hepatosplenomegaly BACK:  The back appears normal and is non-tender to palpation, there is no CVA tenderness EXT: Normal ROM in all joints; non-tender to palpation; no edema; normal capillary refill; no cyanosis, no calf tenderness or swelling    SKIN: Normal color for age and race; warm; no rash NEURO: Moves all  extremities equally PSYCH: The patient's mood and manner are appropriate. Grooming and personal hygiene are appropriate.  MEDICAL DECISION MAKING: Patient here with new onset atrial fibrillation. He is in A. fib with RVR. He has no risk factors for pulmonary embolus, ACS for dissection. Denies any pain at this time. The state he felt slightly lightheaded and short of breath and like he "ran a marathon". Symptom onset at 9:30 PM. We'll discuss with cardiology for their recommendations. I feel patient is likely a great candidate for cardioversion. Have discussed this with patient and father at bedside. We'll obtain labs, urine, chest x-ray.  ED PROGRESS: Discussed case with Dr. Aundra Dubin with cardiology. He agrees patient would be a good candidate for cardioversion given symptom onset just a few hours ago. He recommends starting patient on Xarelto for atrial fibrillation and having him to be followed in the atrium fibrillation clinic. Discussed this with patient and father and they are both comfortable with this plan. They have been consented for sedation and cardioversion.    CHADS Vasc 2 = 0   Patient sedated using propofol 70 mg and cardioverted with standardized cardioversion at 150 J. Patient converted into a sinus rhythm without any complications. He currently has no complaints. Labs unremarkable including normal electrolytes, TSH and negative troponin. Urine pending. We'll continue to monitor patient closely in the emergency department.  2:40 AM  Pt is still hemodynamically stable and a sinus rhythm. He has no complaints of chest pain, shortness of breath, dizziness. He is neurologically intact. I feel he is safe to be discharged home on Xarelto with follow-up at the atrial fibrillation clinic. A referral has been placed. Discussed with pt and father return precautions with patient and family. He is comfortable with this plan. Given this is an isolated episode, we are not discharging him home on any  antiarrhythmics, rate control medications.   At this time, I do not feel there is any life-threatening condition present. I have reviewed and discussed all results (EKG, imaging, lab, urine as appropriate) and exam findings with patient/family. I have reviewed nursing notes and appropriate previous records.  I feel the patient is safe to be discharged home without further emergent workup and can continue workup as an outpatient as needed. Discussed usual and customary return precautions. Patient/family verbalize understanding and are comfortable with this plan.  Outpatient follow-up has been provided if needed. All questions have been answered.   EKG Interpretation  Date/Time:  Monday February 07 2017 23:03:02 EDT Ventricular Rate:  173 PR Interval:    QRS Duration: 110 QT Interval:  284 QTC Calculation: 481 R Axis:   73 Text Interpretation:  Atrial fibrillation with rapid ventricular response RSR' or QR pattern in V1 suggests right ventricular conduction delay Marked ST abnormality, possible inferior subendocardial injury Abnormal ECG No old tracing to compare Confirmed by Icelyn Navarrete,  DO, Sonam Huelsmann [phone]) on 02/07/2017 11:09:58 PM       EKG Interpretation  Date/Time:  Tuesday February 08 2017 00:17:03 EDT Ventricular Rate:  84 PR Interval:    QRS Duration: 122 QT Interval:  331 QTC Calculation: 392 R Axis:  71 Text Interpretation:  Sinus rhythm Left atrial enlargement Right bundle branch block A fib has now resolved Confirmed by Xayla Puzio,  DO, Nneka Blanda ([id number]) on 02/08/2017 12:38:29 AM        Procedural sedation Performed by: Nyra Jabs Consent: Verbal consent obtained. Risks and benefits: risks, benefits and alternatives were discussed Required items: required blood products, implants, devices, and special equipment available Patient identity confirmed: arm band and provided demographic data Time out: Immediately prior to procedure a "time out" was called to verify the correct patient,  procedure, equipment, support staff and site/side marked as required.  Sedation type: moderate (conscious) sedation NPO time confirmed and considedered  Sedatives: PROPOFOL  Physician Time at Bedside: 20 minutes  Vitals: Vital signs were monitored during sedation. Cardiac Monitor, pulse oximeter Patient tolerance: Patient tolerated the procedure well with no immediate complications. Comments: Pt with uneventful recovered. Returned to pre-procedural sedation baseline   .Cardioversion Date/Time: 02/08/2017 12:33 AM Performed by: Nyra Jabs Authorized by: Nyra Jabs   Consent:    Consent obtained:  Written   Consent given by:  Patient   Risks discussed:  Cutaneous burn, death, induced arrhythmia and pain   Alternatives discussed:  Rate-control medication Pre-procedure details:    Cardioversion basis:  Elective   Rhythm:  Atrial fibrillation   Electrode placement:  Anterior-posterior Attempt one:    Cardioversion mode:  Synchronous   Shock (Joules):  150   Shock outcome:  Conversion to normal sinus rhythm Post-procedure details:    Patient status:  Awake   Patient tolerance of procedure:  Tolerated well, no immediate complications     CRITICAL CARE Performed by: Nyra Jabs   Total critical care time: 35 minutes  Critical care time was exclusive of separately billable procedures and treating other patients.  Critical care was necessary to treat or prevent imminent or life-threatening deterioration.  Critical care was time spent personally by me on the following activities: development of treatment plan with patient and/or surrogate as well as nursing, discussions with consultants, evaluation of patient's response to treatment, examination of patient, obtaining history from patient or surrogate, ordering and performing treatments and interventions, ordering and review of laboratory studies, ordering and review of radiographic studies, pulse oximetry and  re-evaluation of patient's condition.    I personally performed the services described in this documentation, which was scribed in my presence. The recorded information has been reviewed and is accurate.     Maria Antonia, DO 02/08/17 Jansen, DO 02/08/17 0932

## 2017-02-07 NOTE — ED Triage Notes (Signed)
The pt is c/o a rapid heart rate  For one hour  Pain around his heart from the tachycardia  No previous history.  He denies drug use sitting playing video games when it started

## 2017-02-08 ENCOUNTER — Encounter: Payer: Self-pay | Admitting: Family Medicine

## 2017-02-08 LAB — RAPID URINE DRUG SCREEN, HOSP PERFORMED
Amphetamines: NOT DETECTED
Barbiturates: NOT DETECTED
Benzodiazepines: NOT DETECTED
Cocaine: NOT DETECTED
Opiates: NOT DETECTED
Tetrahydrocannabinol: NOT DETECTED

## 2017-02-08 LAB — URINALYSIS, ROUTINE W REFLEX MICROSCOPIC
Bilirubin Urine: NEGATIVE
Glucose, UA: NEGATIVE mg/dL
Hgb urine dipstick: NEGATIVE
Ketones, ur: NEGATIVE mg/dL
Leukocytes, UA: NEGATIVE
Nitrite: NEGATIVE
Protein, ur: NEGATIVE mg/dL
Specific Gravity, Urine: 1.01 (ref 1.005–1.030)
pH: 6.5 (ref 5.0–8.0)

## 2017-02-08 LAB — BASIC METABOLIC PANEL
Anion gap: 7 (ref 5–15)
BUN: 14 mg/dL (ref 6–20)
CO2: 28 mmol/L (ref 22–32)
Calcium: 9.6 mg/dL (ref 8.9–10.3)
Chloride: 107 mmol/L (ref 101–111)
Creatinine, Ser: 0.85 mg/dL (ref 0.61–1.24)
GFR calc Af Amer: >60 mL/min (ref >60)
GFR calc non Af Amer: >60 mL/min (ref >60)
Glucose, Bld: 101 mg/dL — ABNORMAL HIGH (ref 65–99)
Potassium: 3.7 mmol/L (ref 3.5–5.1)
Sodium: 142 mmol/L (ref 135–145)

## 2017-02-08 LAB — MAGNESIUM: Magnesium: 2.5 mg/dL — ABNORMAL HIGH (ref 1.7–2.4)

## 2017-02-08 LAB — TSH: TSH: 2.734 u[IU]/mL (ref 0.350–4.500)

## 2017-02-08 MED ORDER — RIVAROXABAN 20 MG PO TABS: 20.0000 mg | ORAL_TABLET | Freq: Every day | ORAL | 0 refills | Status: DC

## 2017-02-08 MED ORDER — RIVAROXABAN 20 MG PO TABS
20.0000 mg | ORAL_TABLET | Freq: Every day | ORAL | Status: DC
  Administered 2017-02-08: 20 mg via ORAL
  Filled 2017-02-08: qty 1

## 2017-02-08 MED ORDER — RIVAROXABAN (XARELTO) EDUCATION KIT FOR AFIB PATIENTS
PACK | Freq: Once | Status: AC
  Administered 2017-02-08: 01:00:00
  Filled 2017-02-08: qty 1

## 2017-02-08 MED ORDER — PROPOFOL 10 MG/ML IV BOLUS
INTRAVENOUS | Status: DC | PRN
  Administered 2017-02-08: 70 ug via INTRAVENOUS

## 2017-02-08 NOTE — ED Notes (Signed)
Fluid challenge started. 

## 2017-02-08 NOTE — Sedation Documentation (Signed)
Pt shock at 150J sinc, pt cardioverted to SR.

## 2017-02-08 NOTE — Discharge Instructions (Signed)
Information on my medicine - XARELTO (Rivaroxaban)  This medication education was reviewed with me or my healthcare representative as part of my discharge preparation.  The pharmacist that spoke with me during my hospital stay was:  Rogue Bussing, [facility]  Why was Xarelto prescribed for you? Xarelto was prescribed for you to reduce the risk of a blood clot forming that can cause a stroke if you have a medical condition called atrial fibrillation (a type of irregular heartbeat).  What do you need to know about xarelto ? Take your Xarelto ONCE DAILY at the same time every day with your evening meal. If you have difficulty swallowing the tablet whole, you may crush it and mix in applesauce just prior to taking your dose.  Take Xarelto exactly as prescribed by your doctor and DO NOT stop taking Xarelto without talking to the doctor who prescribed the medication.  Stopping without other stroke prevention medication to take the place of Xarelto may increase your risk of developing a clot that causes a stroke.  Refill your prescription before you run out.  After discharge, you should have regular check-up appointments with your healthcare provider that is prescribing your Xarelto.  In the future your dose may need to be changed if your kidney function or weight changes by a significant amount.  What do you do if you miss a dose? If you are taking Xarelto ONCE DAILY and you miss a dose, take it as soon as you remember on the same day then continue your regularly scheduled once daily regimen the next day. Do not take two doses of Xarelto at the same time or on the same day.   Important Safety Information A possible side effect of Xarelto is bleeding. You should call your healthcare provider right away if you experience any of the following: ? Bleeding from an injury or your nose that does not stop. ? Unusual colored urine (red or dark brown) or unusual colored stools (red or  black). ? Unusual bruising for unknown reasons. ? A serious fall or if you hit your head (even if there is no bleeding).  Some medicines may interact with Xarelto and might increase your risk of bleeding while on Xarelto. To help avoid this, consult your healthcare provider or pharmacist prior to using any new prescription or non-prescription medications, including herbals, vitamins, non-steroidal anti-inflammatory drugs (NSAIDs) and supplements.  This website has more information on Xarelto: [email or web address]

## 2017-02-08 NOTE — Progress Notes (Signed)
ANTICOAGULATION CONSULT NOTE - Initial Consult  Pharmacy Consult for Xarelto Indication: atrial fibrillation  Allergies  Allergen Reactions  . Neosporin [Neomycin-Polymyxin-Gramicidin]     Wound never heals it gets infected  . Penicillins Rash    Patient Measurements: Height: 6\' 7"  (200.7 cm) Weight: 220 lb (99.8 kg) IBW/kg (Calculated) : 93.7  Vital Signs: Temp: 98.2 F (36.8 C) (03/12 2305) Temp Source: Oral (03/12 2305) BP: 126/87 (03/13 0100) Pulse Rate: 62 (03/13 0100)  Labs:  Recent Labs  02/07/17 2328  HGB 16.1  HCT 44.6  PLT 239  CREATININE 0.85    Estimated Creatinine Clearance: 163.8 mL/min (by C-G formula based on SCr of 0.85 mg/dL).   Medical History: History reviewed. No pertinent past medical history.   Assessment/Plan:  Pt w/ new-onset Afib, to begin Xarelto for stroke prophylaxis.  Recommend Xarelto 20mg  daily w/ evening meal.  First dose hand-delivered to RN.  Counseled pt and pt's father, provided written information and discount card, and added points to discharge summary.  Wynona Neat, PharmD, BCPS  02/08/2017,1:10 AM

## 2017-02-08 NOTE — ED Notes (Signed)
Pharmacy at the bedside

## 2017-02-10 ENCOUNTER — Ambulatory Visit (HOSPITAL_COMMUNITY)
Admission: RE | Admit: 2017-02-10 | Discharge: 2017-02-10 | Disposition: A | Discharge status: Home / Self Care | Payer: BLUE CROSS/BLUE SHIELD | Source: Ambulatory Visit | Attending: Nurse Practitioner | Admitting: Nurse Practitioner

## 2017-02-10 ENCOUNTER — Encounter (HOSPITAL_COMMUNITY): Payer: Self-pay | Admitting: Nurse Practitioner

## 2017-02-10 VITALS — BP 132/76 | HR 79 | Ht 79.0 in | Wt 224.0 lb

## 2017-02-10 DIAGNOSIS — Z7901 Long term (current) use of anticoagulants: Secondary | ICD-10-CM | POA: Diagnosis not present

## 2017-02-10 DIAGNOSIS — Z880 Allergy status to penicillin: Secondary | ICD-10-CM | POA: Diagnosis not present

## 2017-02-10 DIAGNOSIS — I480 Paroxysmal atrial fibrillation: Secondary | ICD-10-CM

## 2017-02-10 DIAGNOSIS — I4891 Unspecified atrial fibrillation: Secondary | ICD-10-CM | POA: Insufficient documentation

## 2017-02-10 MED ORDER — DILTIAZEM HCL 30 MG PO TABS: 30.0000 mg | ORAL_TABLET | ORAL | 2 refills | Status: DC | PRN
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
## 2017-02-10 NOTE — Progress Notes (Signed)
error 

## 2017-02-10 NOTE — Patient Instructions (Signed)
Cardizem 30mg  -- take 1 tablet every 4 hours AS NEEDED for heart rate >100 as long as blood pressure >100.  ONLY take as needed.  Stop Xarelto when finish current prescription  Your physician has requested that you have an echocardiogram. Echocardiography is a painless test that uses sound waves to create images of your heart. It provides your doctor with information about the size and shape of your heart and how well your heart's chambers and valves are working. This procedure takes approximately one hour. There are no restrictions for this procedure.

## 2017-02-24 ENCOUNTER — Ambulatory Visit (HOSPITAL_COMMUNITY)
Admission: RE | Admit: 2017-02-24 | Discharge: 2017-02-24 | Disposition: A | Discharge status: Home / Self Care | Payer: BLUE CROSS/BLUE SHIELD | Source: Ambulatory Visit | Attending: Nurse Practitioner | Admitting: Nurse Practitioner

## 2017-02-24 DIAGNOSIS — R0600 Dyspnea, unspecified: Secondary | ICD-10-CM | POA: Insufficient documentation

## 2017-02-24 DIAGNOSIS — I340 Nonrheumatic mitral (valve) insufficiency: Secondary | ICD-10-CM | POA: Diagnosis not present

## 2017-02-24 DIAGNOSIS — I371 Nonrheumatic pulmonary valve insufficiency: Secondary | ICD-10-CM | POA: Insufficient documentation

## 2017-02-24 DIAGNOSIS — R42 Dizziness and giddiness: Secondary | ICD-10-CM | POA: Diagnosis not present

## 2017-02-24 DIAGNOSIS — I071 Rheumatic tricuspid insufficiency: Secondary | ICD-10-CM | POA: Diagnosis not present

## 2017-02-24 DIAGNOSIS — I480 Paroxysmal atrial fibrillation: Secondary | ICD-10-CM | POA: Diagnosis not present

## 2017-02-24 NOTE — Progress Notes (Signed)
  Echocardiogram 2D Echocardiogram has been performed.  Todd Ford 02/24/2017, 12:04 PM

## 2017-02-28 ENCOUNTER — Encounter (HOSPITAL_COMMUNITY): Payer: Self-pay | Admitting: *Deleted

## 2018-01-16 ENCOUNTER — Encounter: Payer: Self-pay | Admitting: Nurse Practitioner

## 2018-01-16 ENCOUNTER — Ambulatory Visit (INDEPENDENT_AMBULATORY_CARE_PROVIDER_SITE_OTHER): Payer: BLUE CROSS/BLUE SHIELD | Admitting: Nurse Practitioner

## 2018-01-16 VITALS — BP 128/84 | Temp 98.5°F | Ht 79.0 in | Wt 224.0 lb

## 2018-01-16 DIAGNOSIS — H9202 Otalgia, left ear: Secondary | ICD-10-CM | POA: Diagnosis not present

## 2018-01-16 DIAGNOSIS — H6092 Unspecified otitis externa, left ear: Secondary | ICD-10-CM

## 2018-01-16 DIAGNOSIS — J300 Vasomotor rhinitis: Secondary | ICD-10-CM | POA: Diagnosis not present

## 2018-01-16 MED ORDER — TRIAMCINOLONE ACETONIDE 0.1 % EX CREA: 1.0000 "application " | TOPICAL_CREAM | Freq: Two times a day (BID) | CUTANEOUS | 0 refills | Status: DC

## 2018-01-16 NOTE — Patient Instructions (Signed)
OTC antihistamine Nasacort AQ as directed 

## 2018-01-17 ENCOUNTER — Encounter: Payer: Self-pay | Admitting: Nurse Practitioner

## 2018-01-17 NOTE — Progress Notes (Signed)
Subjective:  Presents for c/o left ear pain for the past 2-3 weeks. Tenderness in outer ear canal. Removed a slight "scab" with a Qtip a few days ago. No fever. No sore throat, runny nose or cough. Occasional head congestion. Wears ear plugs at work.   Objective:   BP 128/84   Temp 98.5 F (36.9 C) (Oral)   Ht 6\' 7"  (2.007 m)   Wt 224 lb (101.6 kg)   BMI 25.23 kg/m  NAD. Alert, oriented. TMs retracted bilaterally. Very faint erythema noted near outer portion of left ear canal. No lesions noted. Mild tenderness in this area during otoscopic exam. Pharynx mildly injected with cloudy PND noted. Neck supple with mild anterior adenopathy. Lungs clear. Heart RRR.   Assessment:  Left ear pain  Inflammation of left ear canal  Vasomotor rhinitis    Plan:   Meds ordered this encounter  Medications  . triamcinolone cream (KENALOG) 0.1 %    Sig: Apply 1 application topically 2 (two) times daily. use up to 2 weeks    Dispense:  30 g    Refill:  0    Order Specific Question:   Supervising Provider    Answer:   Mikey Kirschner [[id number]]   Apply a small amount of cream to irritated area using cotton applicator.  OTC antihistamine and steroid nasal spray as directed.  Call back if persists.

## 2018-08-23 ENCOUNTER — Ambulatory Visit (INDEPENDENT_AMBULATORY_CARE_PROVIDER_SITE_OTHER): Payer: BLUE CROSS/BLUE SHIELD | Admitting: Family Medicine

## 2018-08-23 ENCOUNTER — Encounter: Payer: Self-pay | Admitting: Family Medicine

## 2018-08-23 VITALS — BP 134/82 | Ht 79.0 in | Wt 232.1 lb

## 2018-08-23 DIAGNOSIS — B9689 Other specified bacterial agents as the cause of diseases classified elsewhere: Secondary | ICD-10-CM | POA: Diagnosis not present

## 2018-08-23 DIAGNOSIS — J019 Acute sinusitis, unspecified: Secondary | ICD-10-CM | POA: Diagnosis not present

## 2018-08-23 MED ORDER — DOXYCYCLINE HYCLATE 100 MG PO TABS: 100.0000 mg | ORAL_TABLET | Freq: Two times a day (BID) | ORAL | 0 refills | Status: DC

## 2018-08-23 NOTE — Progress Notes (Signed)
   Subjective:    Patient ID: Todd Ford, male    DOB: 06/12/1986, 32 y.o.   MRN: [id number]  HPI Patient is here today with complaints of a sore throat and a cough,bilateral ear pressure,runny nose,headache.Coughing up some mucus for a month now. He has been taking alka seltzer cold and flu, and mucinex. Patient with significant head congestion drainage coughing denies high fever chills sweats relates a lot of sinus pressure  Review of Systems  Constitutional: Negative for activity change, chills and fever.  HENT: Positive for congestion and rhinorrhea. Negative for ear pain.   Eyes: Negative for discharge.  Respiratory: Positive for cough. Negative for wheezing.   Cardiovascular: Negative for chest pain.  Gastrointestinal: Negative for nausea and vomiting.  Musculoskeletal: Negative for arthralgias.       Objective:   Physical Exam  Constitutional: He appears well-developed.  HENT:  Head: Normocephalic.  Mouth/Throat: Oropharynx is clear and moist. No oropharyngeal exudate.  Neck: Normal range of motion.  Cardiovascular: Normal rate, regular rhythm and normal heart sounds.  No murmur heard. Pulmonary/Chest: Effort normal and breath sounds normal. He has no wheezes.  Lymphadenopathy:    He has no cervical adenopathy.  Neurological: He exhibits normal muscle tone.  Skin: Skin is warm and dry.  Nursing note and vitals reviewed.         Assessment & Plan:  Underlying virus Secondary rhinosinusitis Antibiotics prescribed warning signs discussed follow-up if problems

## 2020-06-09 DIAGNOSIS — H81399 Other peripheral vertigo, unspecified ear: Secondary | ICD-10-CM | POA: Diagnosis not present

## 2020-08-21 DIAGNOSIS — L723 Sebaceous cyst: Secondary | ICD-10-CM | POA: Diagnosis not present

## 2020-09-04 DIAGNOSIS — D485 Neoplasm of uncertain behavior of skin: Secondary | ICD-10-CM | POA: Diagnosis not present

## 2020-09-04 DIAGNOSIS — D171 Benign lipomatous neoplasm of skin and subcutaneous tissue of trunk: Secondary | ICD-10-CM | POA: Diagnosis not present

## 2020-10-01 DIAGNOSIS — D171 Benign lipomatous neoplasm of skin and subcutaneous tissue of trunk: Secondary | ICD-10-CM | POA: Diagnosis not present

## 2020-10-01 DIAGNOSIS — D1779 Benign lipomatous neoplasm of other sites: Secondary | ICD-10-CM | POA: Diagnosis not present

## 2020-10-02 DIAGNOSIS — D485 Neoplasm of uncertain behavior of skin: Secondary | ICD-10-CM | POA: Diagnosis not present

## 2021-06-22 DIAGNOSIS — J988 Other specified respiratory disorders: Secondary | ICD-10-CM | POA: Diagnosis not present

## 2021-06-22 DIAGNOSIS — B349 Viral infection, unspecified: Secondary | ICD-10-CM | POA: Diagnosis not present

## 2021-06-29 ENCOUNTER — Encounter: Payer: Self-pay | Admitting: Emergency Medicine

## 2021-06-29 ENCOUNTER — Ambulatory Visit
Admission: EM | Admit: 2021-06-29 | Discharge: 2021-06-29 | Disposition: A | Discharge status: Home / Self Care | Payer: BC Managed Care – PPO | Attending: Family Medicine | Admitting: Family Medicine

## 2021-06-29 ENCOUNTER — Other Ambulatory Visit: Payer: Self-pay

## 2021-06-29 DIAGNOSIS — R059 Cough, unspecified: Secondary | ICD-10-CM

## 2021-06-29 DIAGNOSIS — J209 Acute bronchitis, unspecified: Secondary | ICD-10-CM | POA: Diagnosis not present

## 2021-06-29 DIAGNOSIS — R509 Fever, unspecified: Secondary | ICD-10-CM

## 2021-06-29 MED ORDER — PREDNISONE 20 MG PO TABS: 40.0000 mg | ORAL_TABLET | Freq: Every day | ORAL | 0 refills | Status: DC

## 2021-06-29 MED ORDER — DOXYCYCLINE HYCLATE 100 MG PO CAPS: 100.0000 mg | ORAL_CAPSULE | Freq: Two times a day (BID) | ORAL | 0 refills | Status: DC

## 2021-06-29 MED ORDER — PROMETHAZINE-DM 6.25-15 MG/5ML PO SYRP: 5.0000 mL | ORAL_SOLUTION | Freq: Four times a day (QID) | ORAL | 0 refills | Status: DC | PRN

## 2021-06-29 NOTE — ED Triage Notes (Signed)
Cough, runny nose and fever x 2 weeks. Given 3 day prednisone tx last week from online visit and told to take claritin D.  Pt states he still does not feel better.

## 2021-06-29 NOTE — ED Provider Notes (Signed)
[facility]    CSN: [id number] Arrival date & time: 06/29/21  0927      History   Chief Complaint Chief Complaint  Patient presents with   Cough    HPI Todd Ford is a 35 y.o. male.   HPI Patient presents today for evaluation of URI symptoms cough and runny nose x2 weeks.  He was treated via virtual visit with 3 days of prednisone and Claritin-D which did not resolve his symptoms.  Over the last 24 hours he has developed fevers in the 100s along with nasal drainage and achiness.  He is afebrile at present and has not required any medication to alleviate fever. He continues to cough and has shortness of breath only with coughing.  He does complain of a slight increased effort for use in the center of his chest although denies any overt shortness of breath. Past Medical History:  Diagnosis Date   Atrial fibrillation with rapid ventricular response ([place]) 02/07/2017   cardioverted in ED    Patient Active Problem List   Diagnosis Date Noted   Abdominal pain, right upper quadrant 03/29/2011    Past Surgical History:  Procedure Laterality Date   arm surgery     right, bone spur       Home Medications    Prior to Admission medications   Medication Sig Start Date End Date Taking? Authorizing Provider  doxycycline (VIBRAMYCIN) 100 MG capsule Take 1 capsule (100 mg total) by mouth 2 (two) times daily. 06/29/21  Yes Scot Jun, FNP  predniSONE (DELTASONE) 20 MG tablet Take 2 tablets (40 mg total) by mouth daily with breakfast. 06/29/21  Yes Scot Jun, FNP  promethazine-dextromethorphan (PROMETHAZINE-DM) 6.25-15 MG/5ML syrup Take 5 mLs by mouth 4 (four) times daily as needed for cough. 06/29/21  Yes Scot Jun, FNP  Ascorbic Acid (VITAMIN C PO) Take 1 tablet by mouth daily.    [provider]  diltiazem (CARDIZEM) 30 MG tablet Take 1 tablet (30 mg total) by mouth as needed. Cardizem '30mg'$  -- take 1 tablet every 4 hours AS NEEDED for  heart rate >100 as long as blood pressure >100. 02/10/17   Sherran Needs, NP  loratadine (CLARITIN) 10 MG tablet Take 10 mg by mouth daily.      [provider]  Multiple Vitamin (MULTIVITAMIN WITH MINERALS) TABS tablet Take 1 tablet by mouth daily.    [provider]  rivaroxaban (XARELTO) 20 MG TABS tablet Take 1 tablet (20 mg total) by mouth daily with supper. 02/08/17   Ward, Delice Bison, DO  triamcinolone cream (KENALOG) 0.1 % Apply 1 application topically 2 (two) times daily. use up to 2 weeks 01/16/18   Nilda Simmer, NP    Family History Family History  Problem Relation Age of Onset   Lung cancer Paternal Grandfather     Social History Social History   Tobacco Use   Smoking status: Never   Smokeless tobacco: Never  Substance Use Topics   Alcohol use: Yes    Comment: rare   Drug use: No     Allergies   Neosporin [neomycin-polymyxin-gramicidin] and Penicillins   Review of Systems Review of Systems Pertinent negatives listed in HPI   Physical Exam Triage Vital Signs ED Triage Vitals  Enc Vitals Group     BP 06/29/21 1224 (!) 151/82     Pulse Rate 06/29/21 1224 99     Resp 06/29/21 1224 16     Temp 06/29/21 1224  98.2 F (36.8 C)     Temp Source 06/29/21 1224 Tympanic     SpO2 06/29/21 1224 96 %     Weight --      Height --      Head Circumference --      Peak Flow --      Pain Score 06/29/21 1231 0     Pain Loc --      Pain Edu? --      Excl. in [place]? --    No data found.  Updated Vital Signs BP (!) 151/82 (BP Location: Right Arm)   Pulse 99   Temp 98.2 F (36.8 C) (Tympanic)   Resp 16   SpO2 96%   Visual Acuity Right Eye Distance:   Left Eye Distance:   Bilateral Distance:    Right Eye Near:   Left Eye Near:    Bilateral Near:     Physical Exam General appearance: alert, Ill-appearing, no distress Head: Normocephalic, without obvious abnormality, atraumatic ENT: Ears wnl, mucosal edema, congestion,  oropharynx w/o  exudate Respiratory: Respirations even , unlabored, coarse lung sound, expiratory wheeze Heart: rate and rhythm normal. No gallop or murmurs noted on exam  Abdomen: BS +, no distention, no rebound tenderness, or no mass Extremities: No gross deformities Skin: Skin color, texture, turgor normal. No rashes seen  Psych: Appropriate mood and affect. Neurologic: GCS 15, normal coordination normal UC Treatments / Results  Labs (all labs ordered are listed, but only abnormal results are displayed) Labs Reviewed - No data to display  EKG   Radiology No results found.  Procedures Procedures (including critical care time)  Medications Ordered in UC Medications - No data to display  Initial Impression / Assessment and Plan / UC Course  I have reviewed the triage vital signs and the nursing notes.  Pertinent labs & imaging results that were available during my care of the patient were reviewed by me and considered in my medical decision making (see chart for details).     Treating for acute bronchitis, cough and fever.   Patient declined COVID testing per nursing staff. Treatment per discharge medication orders.  Return precautions given if symptoms worsen or do not improve. Final Clinical Impressions(s) / UC Diagnoses   Final diagnoses:  Acute bronchitis, unspecified organism  Cough  Fever, unspecified   Discharge Instructions   None    ED Prescriptions     Medication Sig Dispense Auth. Provider   predniSONE (DELTASONE) 20 MG tablet Take 2 tablets (40 mg total) by mouth daily with breakfast. 10 tablet Scot Jun, FNP   doxycycline (VIBRAMYCIN) 100 MG capsule Take 1 capsule (100 mg total) by mouth 2 (two) times daily. 20 capsule Scot Jun, FNP   promethazine-dextromethorphan (PROMETHAZINE-DM) 6.25-15 MG/5ML syrup Take 5 mLs by mouth 4 (four) times daily as needed for cough. 140 mL Scot Jun, FNP      PDMP not reviewed this encounter.   Scot Jun, FNP 06/29/21 1420

## 2021-06-29 NOTE — ED Notes (Signed)
Refused covid swab.

## 2021-09-17 ENCOUNTER — Other Ambulatory Visit: Payer: Self-pay | Admitting: Family Medicine

## 2021-09-17 ENCOUNTER — Ambulatory Visit
Admission: RE | Admit: 2021-09-17 | Discharge: 2021-09-17 | Disposition: A | Discharge status: Home / Self Care | Payer: No Typology Code available for payment source | Source: Ambulatory Visit | Attending: Family Medicine | Admitting: Family Medicine

## 2021-09-17 DIAGNOSIS — T1490XA Injury, unspecified, initial encounter: Secondary | ICD-10-CM

## 2021-12-16 ENCOUNTER — Encounter: Payer: Self-pay | Admitting: Nurse Practitioner

## 2021-12-16 ENCOUNTER — Ambulatory Visit: Payer: BC Managed Care – PPO | Admitting: Nurse Practitioner

## 2021-12-16 ENCOUNTER — Other Ambulatory Visit: Payer: Self-pay

## 2021-12-16 VITALS — BP 130/78 | HR 62 | Ht 79.0 in | Wt 233.4 lb

## 2021-12-16 DIAGNOSIS — R5383 Other fatigue: Secondary | ICD-10-CM | POA: Diagnosis not present

## 2021-12-16 DIAGNOSIS — R030 Elevated blood-pressure reading, without diagnosis of hypertension: Secondary | ICD-10-CM

## 2021-12-16 DIAGNOSIS — Z1329 Encounter for screening for other suspected endocrine disorder: Secondary | ICD-10-CM | POA: Diagnosis not present

## 2021-12-16 DIAGNOSIS — Z13220 Encounter for screening for lipoid disorders: Secondary | ICD-10-CM | POA: Diagnosis not present

## 2021-12-16 NOTE — Progress Notes (Signed)
Subjective:    Patient ID: Todd Ford, male    DOB: 1985-12-30, 36 y.o.   MRN: [id number]  HPI  Patient arrives to discuss elevated blood pressure. Patient reports home BP pressures of 130-140 / 70-80 over the last two days. Patient states that his BP is normally 120/70. Patient admits to having headaches and "hot ears" x2-3 days. He describes that his head feels like a balloon and will throb. Pain relieved with OTC ibuprofen and rest. Patient denies changes to vision, palpitations, light headedness, muscle weakness, chest pain. However patient does admit to feeling winded this morning where he notices that he is slightly short of breath during activities that did not previously cause him to be short of breath. Patient also admits to mild ear ache, some nasal congestion, and cold feet.   Patient has hx of Afib. Previously on Cardizem and Xarelto in 2018. Patient states that cardiologist took him off his medications shortly after as he was no longer in Afib.    Review of Systems  HENT:  Positive for congestion and ear pain.   Endocrine: Positive for cold intolerance.  All other systems reviewed and are negative.     Objective:   Physical Exam Constitutional:      General: He is not in acute distress.    Appearance: Normal appearance. He is normal weight. He is not ill-appearing or toxic-appearing.  HENT:     Right Ear: Tympanic membrane, ear canal and external ear normal. There is no impacted cerumen.     Left Ear: Tympanic membrane, ear canal and external ear normal. There is no impacted cerumen.     Nose: Nose normal. No congestion or rhinorrhea.     Mouth/Throat:     Mouth: Mucous membranes are moist.     Pharynx: No oropharyngeal exudate or posterior oropharyngeal erythema.  Eyes:     General: No visual field deficit. Cardiovascular:     Rate and Rhythm: Regular rhythm. Bradycardia present.     Pulses: Normal pulses.     Heart sounds: No murmur heard.   No friction rub.  No gallop.  Musculoskeletal:     Cervical back: Normal range of motion and neck supple. No rigidity or tenderness.  Lymphadenopathy:     Cervical: No cervical adenopathy.  Skin:    General: Skin is warm.  Neurological:     General: No focal deficit present.     Mental Status: He is alert and oriented to person, place, and time.     Cranial Nerves: Cranial nerves 2-12 are intact. No cranial nerve deficit, dysarthria or facial asymmetry.     Sensory: No sensory deficit.     Motor: No weakness, abnormal muscle tone or pronator drift.     Coordination: Romberg sign negative. Coordination normal. Finger-Nose-Finger Test and Heel to [facility] Test normal. Rapid alternating movements normal.     Gait: Gait is intact. Gait and tandem walk normal.  Psychiatric:        Mood and Affect: Mood normal.        Behavior: Behavior normal.          Assessment & Plan:  1. Elevated BP without diagnosis of hypertension - BP 130/78. Goal 120/80, however 130/80 is reasonable goal as well if tolerated.  - Borderline elevated BP in the office. Will manage BP with lifestyle changes for now. Well balanced diet, decrease sodium intake, stress management, and increased exercise.  - Patient instructed to take BP at home and log them  on BP log and send readings to me via Mychart in 1-2 weeks.  - If BP readings at home higher than office BP will start patient on low dose antihypertensive. (Likely a CCB or ARB). - Patient's symptoms are vague with unknown etiology. Not convinced that headache and ear burning are associated with BP of 138/78 unless Bps are running much higher at home. Will evaluated other possible causes of headache, ear burning, and cold feet (such as anemia and thyroid abnormalities). - EKG 12-Lead = Brady Sinus, no AFIB noted today. - CMP14+EGFR - CBC with Differential - TSH + free T4 - Lipid Profile - RTC if symptoms not better or worse - RTC in 4 weeks for re-evaluation of BP  *Note completed  by Mariane Baumgarten, FNP-BC

## 2021-12-16 NOTE — Progress Notes (Addendum)
Subjective:    Patient ID: Todd Ford, male    DOB: [birth year], 36 y.o.   MRN: [id number]  HPI  Patient arrives to discuss elevated blood pressure. Patient reports home BP pressures of 130-140 / 70-80 over the last two days. Patient states that his BP is normally 120/70. Patient admits to having headaches and "hot ears" x2-3 days. He describes that his head feels like a balloon and will throb. Pain relieved with OTC ibuprofen and rest. Patient denies changes to vision, palpitations, light headedness, muscle weakness, chest pain. However patient does admit to feeling winded this morning where he notices that he is slightly short of breath during activities that did not previously cause him to be short of breath. Patient also admits to mild ear ache, some nasal congestion, and cold feet.   Patient has hx of Afib. Previously on Cardizem and Xarelto in 2018. Patient states that cardiologist took him off his medications shortly after as he was no longer in Afib.    Review of Systems  HENT:  Positive for congestion and ear pain.   Endocrine: Positive for cold intolerance.  Neurological:  Positive for headaches.  All other systems reviewed and are negative.     Objective:   Physical Exam Constitutional:      General: He is not in acute distress.    Appearance: Normal appearance. He is normal weight. He is not ill-appearing or toxic-appearing.  HENT:     Right Ear: Tympanic membrane, ear canal and external ear normal. There is no impacted cerumen.     Left Ear: Tympanic membrane, ear canal and external ear normal. There is no impacted cerumen.     Nose: Nose normal. No congestion or rhinorrhea.     Mouth/Throat:     Mouth: Mucous membranes are moist.     Pharynx: No oropharyngeal exudate or posterior oropharyngeal erythema.  Eyes:     General: No visual field deficit. Cardiovascular:     Rate and Rhythm: Regular rhythm. Bradycardia present.     Pulses: Normal pulses.     Heart  sounds: No murmur heard.   No friction rub. No gallop.  Musculoskeletal:     Cervical back: Normal range of motion and neck supple. No rigidity or tenderness.  Lymphadenopathy:     Cervical: No cervical adenopathy.  Skin:    General: Skin is warm.  Neurological:     General: No focal deficit present.     Mental Status: He is alert and oriented to person, place, and time.     Cranial Nerves: Cranial nerves 2-12 are intact. No cranial nerve deficit, dysarthria or facial asymmetry.     Sensory: No sensory deficit.     Motor: No weakness, abnormal muscle tone or pronator drift.     Coordination: Romberg sign negative. Coordination normal. Finger-Nose-Finger Test and Heel to [facility] Test normal. Rapid alternating movements normal.     Gait: Gait is intact. Gait and tandem walk normal.  Psychiatric:        Mood and Affect: Mood normal.        Behavior: Behavior normal.          Assessment & Plan:  1. Elevated BP without diagnosis of hypertension - BP 130/78. Goal 120/80, however 130/80 is reasonable goal as well if tolerated.  - Borderline elevated BP in the office. Will manage BP with lifestyle changes for now. Well balanced diet, decrease sodium intake, stress management, and increased exercise.  - Patient instructed to take  BP at home and log them on BP log and send readings to me via Mychart in 1-2 weeks.  - If BP readings at home higher than office BP will start patient on low dose antihypertensive. (Likely a CCB or ARB). - Patient's symptoms are vague with unknown etiology. Not convinced that headache and ear burning are associated with BP of 138/78 unless Bps are running much higher at home. Will evaluated other possible causes of headache, ear burning, and cold feet (such as anemia and thyroid abnormalities). - EKG 12-Lead = Brady Sinus, no AFIB noted today. - CMP14+EGFR - CBC with Differential - TSH + free T4 - Lipid Profile - RTC if symptoms not better or worse - RTC in 4 weeks  for re-evaluation of BP  *Note completed by Mariane Baumgarten, FNP-BC

## 2021-12-17 LAB — CBC WITH DIFFERENTIAL/PLATELET
Basophils Absolute: 0.1 10*3/uL (ref 0.0–0.2)
Basos: 2 %
EOS (ABSOLUTE): 0.3 10*3/uL (ref 0.0–0.4)
Eos: 5 %
Hematocrit: 47.7 % (ref 37.5–51.0)
Hemoglobin: 16.2 g/dL (ref 13.0–17.7)
Immature Grans (Abs): 0 10*3/uL (ref 0.0–0.1)
Immature Granulocytes: 0 %
Lymphocytes Absolute: 1.7 10*3/uL (ref 0.7–3.1)
Lymphs: 33 %
MCH: 30.2 pg (ref 26.6–33.0)
MCHC: 34 g/dL (ref 31.5–35.7)
MCV: 89 fL (ref 79–97)
Monocytes Absolute: 0.7 10*3/uL (ref 0.1–0.9)
Monocytes: 12 %
Neutrophils Absolute: 2.6 10*3/uL (ref 1.4–7.0)
Neutrophils: 48 %
Platelets: 268 10*3/uL (ref 150–450)
RBC: 5.37 x10E6/uL (ref 4.14–5.80)
RDW: 13.6 % (ref 11.6–15.4)
WBC: 5.3 10*3/uL (ref 3.4–10.8)

## 2021-12-17 LAB — CMP14+EGFR
ALT: 26 IU/L (ref 0–44)
AST: 26 IU/L (ref 0–40)
Albumin/Globulin Ratio: 2.5 — ABNORMAL HIGH (ref 1.2–2.2)
Albumin: 5.3 g/dL — ABNORMAL HIGH (ref 4.0–5.0)
Alkaline Phosphatase: 107 IU/L (ref 44–121)
BUN/Creatinine Ratio: 15 (ref 9–20)
BUN: 14 mg/dL (ref 6–20)
Bilirubin Total: 0.6 mg/dL (ref 0.0–1.2)
CO2: 27 mmol/L (ref 20–29)
Calcium: 9.9 mg/dL (ref 8.7–10.2)
Chloride: 99 mmol/L (ref 96–106)
Creatinine, Ser: 0.95 mg/dL (ref 0.76–1.27)
Globulin, Total: 2.1 g/dL (ref 1.5–4.5)
Glucose: 92 mg/dL (ref 70–99)
Potassium: 4.7 mmol/L (ref 3.5–5.2)
Sodium: 139 mmol/L (ref 134–144)
Total Protein: 7.4 g/dL (ref 6.0–8.5)
eGFR: 106 mL/min/{1.73_m2} (ref >59)

## 2021-12-17 LAB — TSH+FREE T4
Free T4: 1.48 ng/dL (ref 0.82–1.77)
TSH: 1.36 u[IU]/mL (ref 0.450–4.500)

## 2021-12-17 LAB — LIPID PANEL
Chol/HDL Ratio: 4.7 ratio (ref 0.0–5.0)
Cholesterol, Total: 184 mg/dL (ref 100–199)
HDL: 39 mg/dL — ABNORMAL LOW (ref >39)
LDL Chol Calc (NIH): 129 mg/dL — ABNORMAL HIGH (ref 0–99)
Triglycerides: 89 mg/dL (ref 0–149)
VLDL Cholesterol Cal: 16 mg/dL (ref 5–40)

## 2021-12-18 NOTE — Progress Notes (Signed)
Hello Jaymien!  Overall, your labs look good.  Your cholesterol was a little elevated.  However not high enough for medication at this time.  Continue to try and eat a well-balanced meal with plenty of lean meats and vegetables.  And try to exercise for 30 minutes a day for 3-5 times a week.  I look forward to seeing the results of your blood pressure log.  I hope that you are starting to feel better.  If your symptoms become worse or do not improve before I see you in 4 weeks please come back to be seen sooner.  Barbee Shropshire

## 2022-01-13 ENCOUNTER — Ambulatory Visit: Payer: BC Managed Care – PPO | Admitting: Nurse Practitioner

## 2022-01-13 ENCOUNTER — Encounter: Payer: Self-pay | Admitting: Nurse Practitioner

## 2022-01-22 ENCOUNTER — Other Ambulatory Visit: Payer: Self-pay

## 2022-01-22 ENCOUNTER — Encounter: Payer: Self-pay | Admitting: Family Medicine

## 2022-01-22 ENCOUNTER — Ambulatory Visit: Payer: BC Managed Care – PPO | Admitting: Family Medicine

## 2022-01-22 VITALS — BP 116/68 | Temp 98.2°F | Wt 231.0 lb

## 2022-01-22 DIAGNOSIS — E7849 Other hyperlipidemia: Secondary | ICD-10-CM

## 2022-01-22 NOTE — Progress Notes (Signed)
° °  Subjective:    Patient ID: Rio Kidane, male    DOB: Mar 26, 1986, 36 y.o.   MRN: [id number]  HPI Pt following up on HTN. Pt states he is checking BP every night before going to be. Pt states at times he can feel his bp rising.   Patient states at times his blood pressure is elevated He is trying to eat healthier Trying to stay active Denies any major setbacks  Review of Systems     Objective:   Physical Exam  General-in no acute distress Eyes-no discharge Lungs-respiratory rate normal, CTA CV-no murmurs,RRR Extremities skin warm dry no edema Neuro grossly normal Behavior normal, alert       Assessment & Plan:  Blood pressure good control Healthy diet recommended Cholesterol mildly healthy diet recommended Medications not indicated Follow-up 6 months

## 2022-01-22 NOTE — Patient Instructions (Signed)

## 2022-02-03 ENCOUNTER — Other Ambulatory Visit: Payer: Self-pay | Admitting: Nurse Practitioner

## 2022-07-22 ENCOUNTER — Ambulatory Visit: Payer: BC Managed Care – PPO | Admitting: Family Medicine

## 2022-07-22 VITALS — BP 129/75 | HR 65 | Temp 98.1°F | Ht 79.0 in | Wt 231.0 lb

## 2022-07-22 DIAGNOSIS — B360 Pityriasis versicolor: Secondary | ICD-10-CM | POA: Diagnosis not present

## 2022-07-22 DIAGNOSIS — R030 Elevated blood-pressure reading, without diagnosis of hypertension: Secondary | ICD-10-CM | POA: Diagnosis not present

## 2022-07-22 DIAGNOSIS — E7849 Other hyperlipidemia: Secondary | ICD-10-CM

## 2022-07-22 NOTE — Progress Notes (Signed)
   Subjective:    Patient ID: Todd Ford, male    DOB: 1986/11/28, 36 y.o.   MRN: [id number]  HPI Follow up on blood pressure and cholesterol  On a previous visit his blood pressure was borderline plus also he had mild elevation of cholesterol He denies any major setbacks or problems  Nasal he relates his child has upper respiratory illness and over the past few days he has had a runny nose but no body aches wheezing or difficulty breathing or any significant sinus pressure pain  Tooth infection he did have a tooth infection was treated with antibiotics and had a tooth pulled  rash had intermittent rashes on his back which when I looked at it had the appearance of tinea versicolor   Review of Systems     Objective:   Physical Exam  General-in no acute distress Eyes-no discharge Lungs-respiratory rate normal, CTA CV-no murmurs,RRR Extremities skin warm dry no edema Neuro grossly normal Behavior normal, alert  Rash on the back consistent with tinea versicolor     Assessment & Plan:  >diagmed 1. Other hyperlipidemia Lipid profile recommended.  Patient will do this later this fall - Lipid panel  2. Elevated BP without diagnosis of hypertension Blood pressure decent control watch salt intake stay physically active - Lipid panel  3. Tinea versicolor May try the [facility] that he is trying currently if that does not help notify us we will try antifungal Wellness exam in January or February

## 2022-08-26 ENCOUNTER — Emergency Department (HOSPITAL_COMMUNITY)
Admission: EM | Admit: 2022-08-26 | Discharge: 2022-08-26 | Disposition: A | Discharge status: Home / Self Care | Payer: BC Managed Care – PPO | Attending: Emergency Medicine | Admitting: Emergency Medicine

## 2022-08-26 ENCOUNTER — Emergency Department (HOSPITAL_COMMUNITY): Payer: BC Managed Care – PPO

## 2022-08-26 ENCOUNTER — Other Ambulatory Visit: Payer: Self-pay

## 2022-08-26 DIAGNOSIS — I480 Paroxysmal atrial fibrillation: Secondary | ICD-10-CM | POA: Diagnosis not present

## 2022-08-26 DIAGNOSIS — R531 Weakness: Secondary | ICD-10-CM | POA: Diagnosis not present

## 2022-08-26 DIAGNOSIS — R002 Palpitations: Secondary | ICD-10-CM | POA: Diagnosis not present

## 2022-08-26 LAB — CBC
HCT: 46.1 % (ref 39.0–52.0)
Hemoglobin: 16.1 g/dL (ref 13.0–17.0)
MCH: 30.5 pg (ref 26.0–34.0)
MCHC: 34.9 g/dL (ref 30.0–36.0)
MCV: 87.3 fL (ref 80.0–100.0)
Platelets: 261 10*3/uL (ref 150–400)
RBC: 5.28 MIL/uL (ref 4.22–5.81)
RDW: 12.2 % (ref 11.5–15.5)
WBC: 7.3 10*3/uL (ref 4.0–10.5)
nRBC: 0 % (ref 0.0–0.2)

## 2022-08-26 LAB — BASIC METABOLIC PANEL
Anion gap: 10 (ref 5–15)
BUN: 15 mg/dL (ref 6–20)
CO2: 25 mmol/L (ref 22–32)
Calcium: 9.7 mg/dL (ref 8.9–10.3)
Chloride: 106 mmol/L (ref 98–111)
Creatinine, Ser: 0.98 mg/dL (ref 0.61–1.24)
GFR, Estimated: >60 mL/min (ref >60)
Glucose, Bld: 108 mg/dL — ABNORMAL HIGH (ref 70–99)
Potassium: 4 mmol/L (ref 3.5–5.1)
Sodium: 141 mmol/L (ref 135–145)

## 2022-08-26 LAB — MAGNESIUM: Magnesium: 2.2 mg/dL (ref 1.7–2.4)

## 2022-08-26 MED ORDER — RIVAROXABAN 20 MG PO TABS: 20.0000 mg | ORAL_TABLET | Freq: Every day | ORAL | 0 refills | Status: DC

## 2022-08-26 NOTE — ED Provider Notes (Signed)
[facility] EMERGENCY DEPARTMENT Provider Note   CSN: [id number] Arrival date & time: 08/26/22  0049     History  Chief Complaint  Patient presents with   Palpitations    Todd Ford is a 36 y.o. male.  The history is provided by the patient and medical records.  Palpitations  36 y.o. M with hx of AFIB, not currently on anticoagulation, presenting to the ED for AFIB.  States palpitations started at 11pm after taking a shower.  States persisted for several hours and started to feel lightheaded so came into the ED.  He denies chest pain or SOB.  Hx of AFIB, last episode 5 years ago.  Was on xarelto for about 6 weeks but no longer on anticoagulation.    Home Medications Prior to Admission medications   Medication Sig Start Date End Date Taking? Authorizing Provider  Ascorbic Acid (VITAMIN C PO) Take 1 tablet by mouth daily.    [provider]  loratadine (CLARITIN) 10 MG tablet Take 10 mg by mouth daily.      [provider]  Multiple Vitamin (MULTIVITAMIN WITH MINERALS) TABS tablet Take 1 tablet by mouth daily.    [provider]      Allergies    Neosporin [neomycin-polymyxin-gramicidin] and Penicillins    Review of Systems   Review of Systems  Cardiovascular:  Positive for palpitations.  All other systems reviewed and are negative.   Physical Exam Updated Vital Signs BP 125/85   Pulse 83   Temp 98 F (36.7 C) (Oral)   Resp 13   SpO2 96%   Physical Exam Vitals and nursing note reviewed.  Constitutional:      Appearance: He is well-developed.  HENT:     Head: Normocephalic and atraumatic.  Eyes:     Conjunctiva/sclera: Conjunctivae normal.     Pupils: Pupils are equal, round, and reactive to light.  Cardiovascular:     Rate and Rhythm: Normal rate and regular rhythm.     Heart sounds: Normal heart sounds.     Comments: HR 80's, NSR on exam Pulmonary:     Effort: Pulmonary effort is normal.     Breath  sounds: Normal breath sounds.  Abdominal:     General: Bowel sounds are normal.     Palpations: Abdomen is soft.  Musculoskeletal:        General: Normal range of motion.     Cervical back: Normal range of motion.  Skin:    General: Skin is warm and dry.  Neurological:     Mental Status: He is alert and oriented to person, place, and time.     ED Results / Procedures / Treatments   Labs (all labs ordered are listed, but only abnormal results are displayed) Labs Reviewed  BASIC METABOLIC PANEL - Abnormal; Notable for the following components:      Result Value   Glucose, Bld 108 (*)    All other components within normal limits  CBC  MAGNESIUM    EKG EKG Interpretation  Date/Time:  Thursday August 26 2022 01:30:30 EDT Ventricular Rate:  184 PR Interval:    QRS Duration: 104 QT Interval:  250 QTC Calculation: 437 R Axis:   73 Text Interpretation: Atrial fibrillation with rapid ventricular response RSR' or QR pattern in V1 suggests right ventricular conduction delay Minimal voltage criteria for LVH, may be normal variant ( Sokolow-Lyon ) Confirmed by Ripley Fraise [phone]) on 08/26/2022 2:14:55 AM  Radiology DG CHEST PORT 1  VIEW  Result Date: 08/26/2022 CLINICAL DATA:  Palpitations, weakness EXAM: PORTABLE CHEST 1 VIEW COMPARISON:  02/15/2017 FINDINGS: Cardiac and mediastinal contours are within normal limits. No focal pulmonary opacity. No pleural effusion or pneumothorax. No acute osseous abnormality. IMPRESSION: No acute cardiopulmonary process. Electronically Signed   By: Merilyn Baba M.D.   On: 08/26/2022 02:01    Procedures Procedures    This patients CHA2DS2-VASc Score and unadjusted Ischemic Stroke Rate (% per year) is equal to 0.2 % stroke rate/year from a score of 0  Above score calculated as 1 point each if present [CHF, HTN, DM, Vascular=MI/PAD/Aortic Plaque, Age if 65-74, or Male] Above score calculated as 2 points each if present [Age > 75, or  Stroke/TIA/TE]   Medications Ordered in ED Medications - No data to display  ED Course/ Medical Decision Making/ A&P                           Medical Decision Making Amount and/or Complexity of Data Reviewed Labs: ordered. Radiology: ordered and independent interpretation performed. ECG/medicine tests: ordered and independent interpretation performed.  Risk Prescription drug management.   36 year old male presenting to the ED with palpitations, began acutely at 11 PM after taking a shower.  On arrival to ED found to be in A-fib with RVR rate of 180s.  By time of arrival in treatment room he has self converted and is in normal sinus rhythm with heart rate in the 80s.  He reports feeling much better.  No chest pain or shortness of breath.  Does have history of A-fib once before 5 years ago, no longer on anticoagulation.  Repeat EKG obtained.  Labs are pending.  Chest x-ray is clear.  Will monitor.  Labs as above--essentially normal CBC, no electrolyte derangement.  He did have thyroid studies earlier this year that were normal.  Will monitor to ensure no recurrence of A-fib tonight.  4:04 AMa Patient monitored here for approx 3 hours, remains in NSR with HR in the 80's.  No chest pain or SOB.  Continues to feel well with stable VS.  Feel he is stable for discharge home.  Will re-start his xarelto given recurrence of AFIB tonight, refer back to AFIB clinic.  Can return here for any new/acute changes.  Final Clinical Impression(s) / ED Diagnoses Final diagnoses:  Paroxysmal atrial fibrillation [facility])    Rx / DC Orders ED Discharge Orders     None         Larene Pickett, PA-C 08/26/22 [id number]    Ripley Fraise, MD 08/27/22 [phone]

## 2022-08-26 NOTE — Discharge Instructions (Addendum)
Xarelto sent to your pharmacy.  Take as directed. Monitor for bleeding, use caution if injuries occur. Referral back to AFIB clinic sent.  If you do not hear from them in the next day or so, call to follow-up on appt. Return here for new concerns.

## 2022-08-26 NOTE — ED Triage Notes (Signed)
Palpitations, weakness started around 11pm today. Previous episode of feeling like this needed to be cardioverted 6 years ago Noted to be in afib rvr in triage

## 2022-08-26 NOTE — ED Provider Triage Note (Signed)
Emergency Medicine Provider Triage Evaluation Note  Todd Ford , a 36 y.o. male  was evaluated in triage.  Pt complains of sudden onset palpitations, lightheadedness, general weakness with some shortness of breath that started around 11 PM today.  History of A-fib with RVR requiring cardioversion.  Was temporarily on anticoagulation but is not not currently..  Review of Systems  Positive: As above Negative: Chest pain, syncope  Physical Exam  BP 113/81 (BP Location: Right Arm)   Pulse (!) 187   Temp 98 F (36.7 C) (Oral)   Resp 18   SpO2 98%  Gen:   Awake, no distress   Resp:  Normal effort  MSK:   Moves extremities without difficulty  Other:  Irregularly irregular rhythm with significant tachycardia to the 180s in triage.  Lungs CTA B.  Medical Decision Making  Medically screening exam initiated at 1:41 AM.  Appropriate orders placed.  Braycen Burandt was informed that the remainder of the evaluation will be completed by another provider, this initial triage assessment does not replace that evaluation, and the importance of remaining in the ED until their evaluation is complete.  A-fib with RVR, charge RN aware, patient to be roomed next.  IV placed in triage by RN. This chart was dictated using voice recognition software, Dragon. Despite the best efforts of this provider to proofread and correct errors, errors may still occur which can change documentation meaning.    Emeline Darling, PA-C 08/26/22 [phone]

## 2022-09-13 ENCOUNTER — Ambulatory Visit (HOSPITAL_COMMUNITY)
Admission: RE | Admit: 2022-09-13 | Discharge: 2022-09-13 | Disposition: A | Discharge status: Home / Self Care | Payer: BC Managed Care – PPO | Source: Ambulatory Visit | Attending: Physician Assistant | Admitting: Physician Assistant

## 2022-09-13 ENCOUNTER — Encounter (HOSPITAL_COMMUNITY): Payer: Self-pay | Admitting: Physician Assistant

## 2022-09-13 VITALS — BP 110/70 | HR 80 | Ht 79.0 in | Wt 230.2 lb

## 2022-09-13 DIAGNOSIS — I480 Paroxysmal atrial fibrillation: Secondary | ICD-10-CM | POA: Diagnosis not present

## 2022-09-13 NOTE — Patient Instructions (Signed)
Stop Xarelto

## 2022-09-13 NOTE — Progress Notes (Signed)
Primary Care Physician: Kathyrn Drown, MD Referring Physician: [facility] ER f/u   Todd Ford is a 36 y.o. male with a h/o significant PMH, who presented to [facility] ER, 3/13, in new onset afib with RVR at 178 bpm.Pt reports while watching tv and eating cereal this evening, the sudden onset palpitations arose. He states it felt like "I just ran a marathon". He has never had anything like this happen before. He has associated symptoms of SOB and mild dizziness. He had a successful cardioversion and was d/c on xarelto 20 mg qd.  He is in the afib for f/u. He is in Sr. He reports no unusual caffeine use, no tobacco use, no alcohol use.Marland Kitchen No illicit drug use and drug screen in the ER confirms this. No recent decongestants. TSH normal. 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 Chadsvasc score of 0.  Follow up in the AF clinic 09/13/22, last seen in 2018. Patient was seen at the ED 08/26/22 with palpitations and lightheadedness. ECG showed afib with RVR. He converted spontaneously while at the ED. There were no specific triggers that he could identify. He works as an Clinical biochemist and did get a small shock at work about 2 weeks prior.   Today, he denies symptoms of palpitations, chest pain, shortness of breath, orthopnea, PND, lower extremity edema, dizziness, presyncope, syncope, or neurologic sequela. The patient is tolerating medications without difficulties and is otherwise without complaint today.   Past Medical History:  Diagnosis Date   Atrial fibrillation with rapid ventricular response ([place]) 02/07/2017   cardioverted in ED   Past Surgical History:  Procedure Laterality Date   arm surgery     right, bone spur    Current Outpatient Medications  Medication Sig Dispense Refill   Ascorbic Acid (VITAMIN C PO) Take 1 tablet by mouth daily.     loratadine (CLARITIN)  10 MG tablet Take 10 mg by mouth daily.       Multiple Vitamin (MULTIVITAMIN WITH MINERALS) TABS tablet Take 1 tablet by mouth daily.     rivaroxaban (XARELTO) 20 MG TABS tablet Take 1 tablet (20 mg total) by mouth daily with supper. 30 tablet 0   No current facility-administered medications for this encounter.    Allergies  Allergen Reactions   Neosporin [Neomycin-Polymyxin-Gramicidin]     Wound never heals it gets infected   Penicillins Rash    Social History   Socioeconomic History   Marital status: Single    Spouse name: Not on file   Number of children: 0   Years of education: Not on file   Highest education level: Not on file  Occupational History   Occupation: electrician  Tobacco Use   Smoking status: Never   Smokeless tobacco: Never   Tobacco comments:    Never smoke 09/13/22  Substance and Sexual Activity   Alcohol use: Yes    Alcohol/week: 1.0 standard drink of alcohol    Types: 1 Standard drinks or equivalent per week    Comment: 1 drink weekly 09/13/22   Drug use: No   Sexual activity: Not on file  Other Topics Concern   Not on file  Social History Narrative   Not on file   Social Determinants of Health   Financial Resource Strain: Not on file  Food Insecurity: Not on file  Transportation Needs:  Not on file  Physical Activity: Not on file  Stress: Not on file  Social Connections: Not on file  Intimate Partner Violence: Not on file    Family History  Problem Relation Age of Onset   Lung cancer Paternal Grandfather     ROS- All systems are reviewed and negative except as per the HPI above  Physical Exam: Vitals:   09/13/22 0931  BP: 110/70  Pulse: 80  Weight: 104.4 kg  Height: '6\' 7"'$  (2.007 m)    Wt Readings from Last 3 Encounters:  09/13/22 104.4 kg  07/22/22 104.8 kg  01/22/22 104.8 kg    Labs: Lab Results  Component Value Date   NA 141 08/26/2022   K 4.0 08/26/2022   CL 106 08/26/2022   CO2 25 08/26/2022   GLUCOSE 108 (H)  08/26/2022   BUN 15 08/26/2022   CREATININE 0.98 08/26/2022   CALCIUM 9.7 08/26/2022   MG 2.2 08/26/2022   No results found for: "INR" Lab Results  Component Value Date   CHOL 184 12/16/2021   HDL 39 (L) 12/16/2021   LDLCALC 129 (H) 12/16/2021   TRIG 89 12/16/2021    GEN- The patient is a well appearing male, alert and oriented x 3 today.   HEENT-head normocephalic, atraumatic, sclera clear, conjunctiva pink, hearing intact, trachea midline. Lungs- Clear to ausculation bilaterally, normal work of breathing Heart- Regular rate and rhythm, no murmurs, rubs or gallops  GI- soft, NT, ND, + BS Extremities- no clubbing, cyanosis, or edema MS- no significant deformity or atrophy Skin- no rash or lesion Psych- euthymic mood, full affect Neuro- strength and sensation are intact   EKG-  SR Vent. rate 80 BPM PR interval 142 ms QRS duration 108 ms QT/QTcB 354/408 ms   Echo 02/24/17 Left ventricle: The cavity size was normal. Systolic function was    normal. The estimated ejection fraction was in the range of 60%    to 65%. Wall motion was normal; there were no regional wall    motion abnormalities.  - Left atrium: The atrium was mildly dilated.  - Right atrium: The atrium was mildly dilated.  - Atrial septum: No defect or patent foramen ovale was identified.  - Impressions: Normal GLS -21.  Epic records reviewed   Assessment and Plan: 1. Paroxysmal atrial fibrillation  Patient appears to be maintaining SR.  Given his spontaneous conversion in ED in < 48 hours onset, OK to discontinue Xarelto with CHA2DS2VASc Score of 0. We discussed rhythm control options. With his young age would favor EP study and possible afib ablation. Patient agreeable to EP referral.    Follow up with EP to establish care and discuss possible ablation.    [facility] [address] [place], [place] [address] [phone]

## 2022-09-19 NOTE — Progress Notes (Unsigned)
Cardiology Office Note:    Date:  09/19/2022   ID:  Todd Ford, DOB Apr 19, 1986, MRN [id number]  PCP:  Kathyrn Drown, MD   [place] Providers Cardiologist:  None Electrophysiologist:  Melida Quitter, MD { Click to update primary MD,subspecialty MD or APP then REFRESH:1}    Referring MD: Oliver Barre, PA   Chief complaint: palpitations  History of Present Illness:    Todd Ford is a 36 y.o. male with no significant past medical history referred for management of atrial fibrillation.  He first presented to the ER in March 2013 with atrial fibrillation and a heart rate of 178 bpm.  He had symptoms of shortness of breath and dizziness.  He was cardioverted and discharged with Xarelto.  He did well for years, maintaining sinus rhythm but had a recurrence in September 23. He converted back to sinus rhythm spontaneously in < 48 hours.  Because he has now had recurrence of AF, he is interested in pursuing a rhythm control strategy.     Past Medical History:  Diagnosis Date   Atrial fibrillation with rapid ventricular response ([place]) 02/07/2017   cardioverted in ED   Bronchitis    Chest wall pain    Elevated BP without diagnosis of hypertension    Hyperlipidemia    Rhinosinusitis     Past Surgical History:  Procedure Laterality Date   arm surgery     right, bone spur    Current Medications: No outpatient medications have been marked as taking for the 09/20/22 encounter (Appointment) with Oland Arquette, Yetta Barre, MD.     Allergies:   Neosporin [neomycin-polymyxin-gramicidin] and Penicillins   Social History   Socioeconomic History   Marital status: Single    Spouse name: Not on file   Number of children: 0   Years of education: Not on file   Highest education level: Not on file  Occupational History   Occupation: electrician  Tobacco Use   Smoking status: Never   Smokeless tobacco: Never   Tobacco comments:    Never smoke 09/13/22   Substance and Sexual Activity   Alcohol use: Yes    Alcohol/week: 1.0 standard drink of alcohol    Types: 1 Standard drinks or equivalent per week    Comment: 1 drink weekly 09/13/22   Drug use: No   Sexual activity: Not on file  Other Topics Concern   Not on file  Social History Narrative   Not on file   Social Determinants of Health   Financial Resource Strain: Not on file  Food Insecurity: Not on file  Transportation Needs: Not on file  Physical Activity: Not on file  Stress: Not on file  Social Connections: Not on file     Family History: The patient's family history includes Lung cancer in his paternal grandfather.  ROS:   Please see the history of present illness.    All other systems reviewed and are negative.  EKGs/Labs/Other Studies Reviewed:     ***  EKG:  Last EKG results: ***   Recent Labs: 12/16/2021: ALT 26; TSH 1.360 08/26/2022: BUN 15; Creatinine, Ser 0.98; Hemoglobin 16.1; Magnesium 2.2; Platelets 261; Potassium 4.0; Sodium 141    Risk Assessment/Calculations:    [id number] Score =    {Click here to calculate score.  REFRESH note before signing. :1} This indicates a  % annual risk of stroke. The patient's score is based upon:           Physical Exam:  VS:  There were no vitals taken for this visit.    Wt Readings from Last 3 Encounters:  09/13/22 230 lb 3.2 oz (104.4 kg)  07/22/22 231 lb (104.8 kg)  01/22/22 231 lb (104.8 kg)     GEN:  Well nourished, well developed in no acute distress CARDIAC: RRR, no murmurs, rubs, gallops RESPIRATORY:  Normal work of breathing MUSCULOSKELETAL: no edema    ASSESSMENT & PLAN:    Paroxysmal atrial fibrillation Long term use of anticoagulation: CHADs2VASC score is 0. I will likely have him discontinue anticoagulation after he recovers from ablation.       Medication Adjustments/Labs and Tests Ordered: Current medicines are reviewed at length with the patient today.  Concerns regarding  medicines are outlined above.  No orders of the defined types were placed in this encounter.  No orders of the defined types were placed in this encounter.    Signed, Melida Quitter, MD  09/19/2022 2:30 PM    [place]

## 2022-09-20 ENCOUNTER — Encounter: Payer: Self-pay | Admitting: Cardiovascular Disease

## 2022-09-20 ENCOUNTER — Ambulatory Visit: Payer: BC Managed Care – PPO | Attending: Cardiovascular Disease | Admitting: Cardiovascular Disease

## 2022-09-20 VITALS — BP 128/62 | HR 67 | Ht 79.0 in | Wt 233.0 lb

## 2022-09-20 DIAGNOSIS — I480 Paroxysmal atrial fibrillation: Secondary | ICD-10-CM

## 2022-09-20 MED ORDER — FLECAINIDE ACETATE 50 MG PO TABS: 100.0000 mg | ORAL_TABLET | Freq: Two times a day (BID) | ORAL | 3 refills | Status: DC | PRN

## 2022-09-20 MED ORDER — METOPROLOL TARTRATE 25 MG PO TABS: 25.0000 mg | ORAL_TABLET | Freq: Two times a day (BID) | ORAL | 2 refills | Status: DC | PRN

## 2022-09-20 NOTE — Patient Instructions (Addendum)
Medication Instructions:  Your physician has recommended you make the following change in your medication:  1) Flecainide 100 mg twice daily as needed 2) Metoprolol 25 mg twice daly as needed *If you need a refill on your cardiac medications before your next appointment, please call your pharmacy*   Testing/Procedures: Your physician has requested that you have an echocardiogram. Echocardiography is a painless test that uses sound waves to create images of your heart. It provides your doctor with information about the size and shape of your heart and how well your heart's chambers and valves are working. This procedure takes approximately one hour. There are no restrictions for this procedure. Please do NOT wear cologne, perfume, aftershave, or lotions (deodorant is allowed). Please arrive 15 minutes prior to your appointment time.  Follow-Up: At [facility], you and your health needs are our priority.  As part of our continuing mission to provide you with exceptional heart care, we have created designated Provider Care Teams.  These Care Teams include your primary Cardiologist (physician) and Advanced Practice Providers (APPs -  Physician Assistants and Nurse Practitioners) who all work together to provide you with the care you need, when you need it.  Your next appointment:   1 year with Dr. Myles Gip  Important Information About Sugar

## 2022-09-29 ENCOUNTER — Encounter: Payer: Self-pay | Admitting: Nurse Practitioner

## 2022-09-29 ENCOUNTER — Ambulatory Visit: Payer: BC Managed Care – PPO | Admitting: Nurse Practitioner

## 2022-09-29 VITALS — Ht 79.0 in | Wt 232.0 lb

## 2022-09-29 DIAGNOSIS — K219 Gastro-esophageal reflux disease without esophagitis: Secondary | ICD-10-CM | POA: Diagnosis not present

## 2022-09-29 DIAGNOSIS — R0789 Other chest pain: Secondary | ICD-10-CM

## 2022-09-29 MED ORDER — OMEPRAZOLE 20 MG PO CPDR: 20.0000 mg | DELAYED_RELEASE_CAPSULE | Freq: Every day | ORAL | 2 refills | Status: DC

## 2022-09-29 NOTE — Progress Notes (Signed)
   Subjective:    Patient ID: Todd Ford, male    DOB: [birth year], 36 y.o.   MRN: [id number]  Hypertension Associated symptoms include shortness of breath.    36 year old male patient with history of paroxysmal AFIB present to the clinic with complaints of chest throbbing, hear burn, chest pressure, sweating, SOB last night. Patient states that at that time his HR was 55 and BP was 140/90. Patient states that eventually the sensation subsided and he went back to sleep.  Patient was seen in ED on 08/26/2022 for AFIB. Patient was seen by Cardiology on 09/13/2022 and 1023/2023 and is scheduled for an ECHO Bubble Study. Patient was also prescribed Flecainide and Lopressor to use as needed for racing heart rate. Patient states that he has not need to use any of those two medications since they were prescribed.   Patient denies any racing heart, current chest pain, current SOB, onset of this began few weeks ago 09/08/22. Pt also c/o headache on and off, chest pain feels a lot of pressure.  Review of Systems  Constitutional:  Positive for diaphoresis.  Respiratory:  Positive for shortness of breath.   Cardiovascular:        Chest pressure, chest pulses.   All other systems reviewed and are negative.      Objective:   Physical Exam Vitals reviewed.  Constitutional:      General: He is not in acute distress.    Appearance: Normal appearance. He is normal weight. He is not ill-appearing, toxic-appearing or diaphoretic.  HENT:     Head: Normocephalic and atraumatic.  Cardiovascular:     Rate and Rhythm: Normal rate and regular rhythm.     Pulses: Normal pulses.     Heart sounds: Normal heart sounds. No murmur heard. Pulmonary:     Effort: Pulmonary effort is normal. No respiratory distress.     Breath sounds: Normal breath sounds. No wheezing.  Musculoskeletal:     Cervical back: Normal range of motion and neck supple. No rigidity or tenderness.     Comments: Grossly intact   Lymphadenopathy:     Cervical: No cervical adenopathy.  Skin:    General: Skin is warm.     Capillary Refill: Capillary refill takes less than 2 seconds.  Neurological:     Mental Status: He is alert.     Comments: Grossly intact  Psychiatric:        Mood and Affect: Mood normal.        Behavior: Behavior normal.           Assessment & Plan:   1. Chest pressure, unspecified type - Unsure if chest pressure cardiac in nature, anxiety, or GERD - Will repeat EKG to ensure patient remains in SR.  - Patient may benefit from telemetry/Zio Patch monitoring to determine if chest sensation related to arrhythmia or possibly anxiety. - EKG 12-Lead = Sinus Rhythm today -Follow up for scheduled Echo Bubble study - Follow up with Cardiology for continued symptoms - RTC if symptoms worsen or do not resolve.   2. Gastroesophageal reflux disease without esophagitis - Refill - omeprazole (PRILOSEC) 20 MG capsule; Take 1 capsule (20 mg total) by mouth daily.  Dispense: 30 capsule; Refill: 2 - Take daily if symptoms uncontrolled. If/when symptoms become more controlled may use as needed.   RTC in January to see Dr. Nicki Reaper as scheduled or sooner as needed.

## 2022-10-01 ENCOUNTER — Ambulatory Visit (HOSPITAL_COMMUNITY): Payer: BC Managed Care – PPO | Attending: Cardiovascular Disease

## 2022-10-01 DIAGNOSIS — I480 Paroxysmal atrial fibrillation: Secondary | ICD-10-CM | POA: Insufficient documentation

## 2022-10-01 LAB — ECHOCARDIOGRAM COMPLETE BUBBLE STUDY
Area-P 1/2: 2.97 cm2
S' Lateral: 2.9 cm

## 2022-12-08 ENCOUNTER — Encounter: Payer: Self-pay | Admitting: Family Medicine

## 2022-12-08 ENCOUNTER — Ambulatory Visit (INDEPENDENT_AMBULATORY_CARE_PROVIDER_SITE_OTHER): Payer: BC Managed Care – PPO | Admitting: Family Medicine

## 2022-12-08 VITALS — BP 125/76 | HR 77 | Wt 231.2 lb

## 2022-12-08 DIAGNOSIS — E7849 Other hyperlipidemia: Secondary | ICD-10-CM | POA: Diagnosis not present

## 2022-12-08 DIAGNOSIS — Z0000 Encounter for general adult medical examination without abnormal findings: Secondary | ICD-10-CM

## 2022-12-08 DIAGNOSIS — Z0001 Encounter for general adult medical examination with abnormal findings: Secondary | ICD-10-CM

## 2022-12-08 NOTE — Progress Notes (Signed)
   Subjective:    Patient ID: Zamir Staples, male    DOB: 03/29/1986, 37 y.o.   MRN: [id number]  HPI The patient comes in today for a wellness visit.  Does not smoke rarely drinks not depressed no accidents  A review of their health history was completed.  A review of medications was also completed.  Any needed refills; none  Eating habits: Good  Falls/  MVA accidents in past few months: none   Regular exercise: yes  Specialist pt sees on regular basis: none  Preventative health issues were discussed.   Additional concerns: heart beating really hard; cardiology wrote off anything pt said     Review of Systems     Objective:   Physical Exam  General-in no acute distress Eyes-no discharge Lungs-respiratory rate normal, CTA CV-no murmurs,RRR Extremities skin warm dry no edema Neuro grossly normal Behavior normal, alert  GU normal     Assessment & Plan:   1. Well adult exam Adult wellness-complete.wellness physical was conducted today. Importance of diet and exercise were discussed in detail.  Importance of stress reduction and healthy living were discussed.  In addition to this a discussion regarding safety was also covered.  We also reviewed over immunizations and gave recommendations regarding current immunization needed for age.   In addition to this additional areas were also touched on including: Preventative health exams needed:  Colonoscopy not indicated  Patient was advised yearly wellness exam  - Lipid panel - Basic metabolic panel - Hepatic function panel  2. Other hyperlipidemia Lipid profile recommended - Lipid panel - Basic metabolic panel - Hepatic function panel

## 2022-12-09 LAB — BASIC METABOLIC PANEL
BUN/Creatinine Ratio: 15 (ref 9–20)
BUN: 13 mg/dL (ref 6–20)
CO2: 26 mmol/L (ref 20–29)
Calcium: 9.3 mg/dL (ref 8.7–10.2)
Chloride: 99 mmol/L (ref 96–106)
Creatinine, Ser: 0.85 mg/dL (ref 0.76–1.27)
Glucose: 87 mg/dL (ref 70–99)
Potassium: 3.9 mmol/L (ref 3.5–5.2)
Sodium: 140 mmol/L (ref 134–144)
eGFR: 114 mL/min/{1.73_m2} (ref >59)

## 2022-12-09 LAB — HEPATIC FUNCTION PANEL
ALT: 23 IU/L (ref 0–44)
AST: 20 IU/L (ref 0–40)
Albumin: 4.9 g/dL (ref 4.1–5.1)
Alkaline Phosphatase: 83 IU/L (ref 44–121)
Bilirubin Total: 0.2 mg/dL (ref 0.0–1.2)
Bilirubin, Direct: <0.1 mg/dL (ref 0.00–0.40)
Total Protein: 7.5 g/dL (ref 6.0–8.5)

## 2022-12-09 LAB — LIPID PANEL
Chol/HDL Ratio: 4.8 ratio (ref 0.0–5.0)
Cholesterol, Total: 186 mg/dL (ref 100–199)
HDL: 39 mg/dL — ABNORMAL LOW (ref >39)
LDL Chol Calc (NIH): 113 mg/dL — ABNORMAL HIGH (ref 0–99)
Triglycerides: 191 mg/dL — ABNORMAL HIGH (ref 0–149)
VLDL Cholesterol Cal: 34 mg/dL (ref 5–40)

## 2023-03-16 ENCOUNTER — Encounter: Payer: Self-pay | Admitting: Family Medicine

## 2023-03-16 ENCOUNTER — Ambulatory Visit: Payer: BC Managed Care – PPO | Admitting: Family Medicine

## 2023-03-16 VITALS — BP 112/68 | HR 74 | Temp 98.2°F | Ht 79.0 in | Wt 227.0 lb

## 2023-03-16 DIAGNOSIS — R350 Frequency of micturition: Secondary | ICD-10-CM

## 2023-03-16 LAB — POCT URINALYSIS DIP (CLINITEK)
Bilirubin, UA: NEGATIVE
Blood, UA: NEGATIVE
Glucose, UA: NEGATIVE mg/dL
Ketones, POC UA: NEGATIVE mg/dL
Leukocytes, UA: NEGATIVE
Nitrite, UA: NEGATIVE
POC PROTEIN,UA: NEGATIVE
Spec Grav, UA: 1.01 (ref 1.010–1.025)
Urobilinogen, UA: 0.2 E.U./dL
pH, UA: 6.5 (ref 5.0–8.0)

## 2023-03-16 MED ORDER — HYDROCORTISONE (PERIANAL) 2.5 % EX CREA: 1.0000 | TOPICAL_CREAM | Freq: Two times a day (BID) | CUTANEOUS | 0 refills | Status: AC

## 2023-03-16 MED ORDER — SULFAMETHOXAZOLE-TRIMETHOPRIM 800-160 MG PO TABS: 1.0000 | ORAL_TABLET | Freq: Two times a day (BID) | ORAL | 0 refills | Status: DC

## 2023-03-16 NOTE — Patient Instructions (Signed)
Medication as prescribed.  If continues to persist, follow up with Dr. Lorin Picket.

## 2023-03-17 DIAGNOSIS — R350 Frequency of micturition: Secondary | ICD-10-CM | POA: Insufficient documentation

## 2023-03-17 NOTE — Assessment & Plan Note (Addendum)
Urine clear.  Suspect prostatitis.  Bactrim as directed.  Also placing patient on Anusol.

## 2023-03-17 NOTE — Progress Notes (Signed)
Subjective:  Patient ID: Todd Ford, male    DOB: 1986/05/07  Age: 37 y.o. MRN: [id number]  CC: Chief Complaint  Patient presents with   pressure when sitting   feeling of urinary urgency    X 4 weeks    HPI:  37 year old male presents for evaluation of the above.  Patient reports that he has had symptoms for the past 4 weeks.  He reports pressure/discomfort in his bottom.  Reports feelings of urinary urgency.  No dysuria.  States that he had some discomfort in his penis as well.  No abdominal pain.  No fever.  Patient states that he had similar symptoms previously and was diagnosed with prostatitis.  Patient also notes that he has had a "bump" from the anus.  This has caused some mild discomfort.  Social Hx   Social History   Socioeconomic History   Marital status: Single    Spouse name: Not on file   Number of children: 0   Years of education: Not on file   Highest education level: Not on file  Occupational History   Occupation: electrician  Tobacco Use   Smoking status: Never   Smokeless tobacco: Never   Tobacco comments:    Never smoke 09/13/22  Substance and Sexual Activity   Alcohol use: Yes    Alcohol/week: 1.0 standard drink of alcohol    Types: 1 Standard drinks or equivalent per week    Comment: 1 drink weekly 09/13/22   Drug use: No   Sexual activity: Not on file  Other Topics Concern   Not on file  Social History Narrative   Not on file   Social Determinants of Health   Financial Resource Strain: Not on file  Food Insecurity: Not on file  Transportation Needs: Not on file  Physical Activity: Not on file  Stress: Not on file  Social Connections: Not on file    Review of Systems Per HPI  Objective:  BP 112/68   Pulse 74   Temp 98.2 F (36.8 C)   Ht  (2.007 m)   Wt 227 lb (103 kg)   SpO2 98%   BMI 25.57 kg/m      03/16/2023    4:04 PM 12/08/2022    2:42 PM 09/29/2022   10:31 AM  BP/Weight  Systolic BP 112 125   Diastolic  BP 68 76   Wt. (Lbs) 227 231.2 232  BMI 25.57 kg/m2 26.05 kg/m2 26.14 kg/m2    Physical Exam Vitals and nursing note reviewed.  Constitutional:      General: He is not in acute distress.    Appearance: Normal appearance.  HENT:     Head: Normocephalic and atraumatic.  Eyes:     General:        Right eye: No discharge.        Left eye: No discharge.     Conjunctiva/sclera: Conjunctivae normal.  Genitourinary:    Comments: Small tear noted on the superior aspect of the anus.  Neurological:     Mental Status: He is alert.  Psychiatric:        Mood and Affect: Mood normal.        Behavior: Behavior normal.     Lab Results  Component Value Date   WBC 7.3 08/26/2022   HGB 16.1 08/26/2022   HCT 46.1 08/26/2022   PLT 261 08/26/2022   GLUCOSE 87 12/08/2022   CHOL 186 12/08/2022   TRIG 191 (H) 12/08/2022   HDL  39 (L) 12/08/2022   LDLCALC 113 (H) 12/08/2022   ALT 23 12/08/2022   AST 20 12/08/2022   NA 140 12/08/2022   K 3.9 12/08/2022   CL 99 12/08/2022   CREATININE 0.85 12/08/2022   BUN 13 12/08/2022   CO2 26 12/08/2022   TSH 1.360 12/16/2021     Assessment & Plan:   Problem List Items Addressed This Visit       Other   Frequency of urination - Primary    Urine clear.  Suspect prostatitis.  Bactrim as directed.  Also placing patient on Anusol.      Relevant Orders   POCT URINALYSIS DIP (CLINITEK) (Completed)    Meds ordered this encounter  Medications   sulfamethoxazole-trimethoprim (BACTRIM DS) 800-160 MG tablet    Sig: Take 1 tablet by mouth 2 (two) times daily.    Dispense:  28 tablet    Refill:  0   hydrocortisone (ANUSOL-HC) 2.5 % rectal cream    Sig: Place 1 Application rectally 2 (two) times daily.    Dispense:  30 g    Refill:  0    Follow-up:  Return if symptoms worsen or fail to improve.  Everlene Other DO [facility]

## 2023-06-03 IMAGING — CR DG FINGER THUMB 2+V*L*
3 series · 3 of 3 positions shown · non-contrast
Comparison: None.

CLINICAL DATA: Crush injury to the thumb, initial encounter

EXAM:
LEFT THUMB 2+V

[x finger pa left]
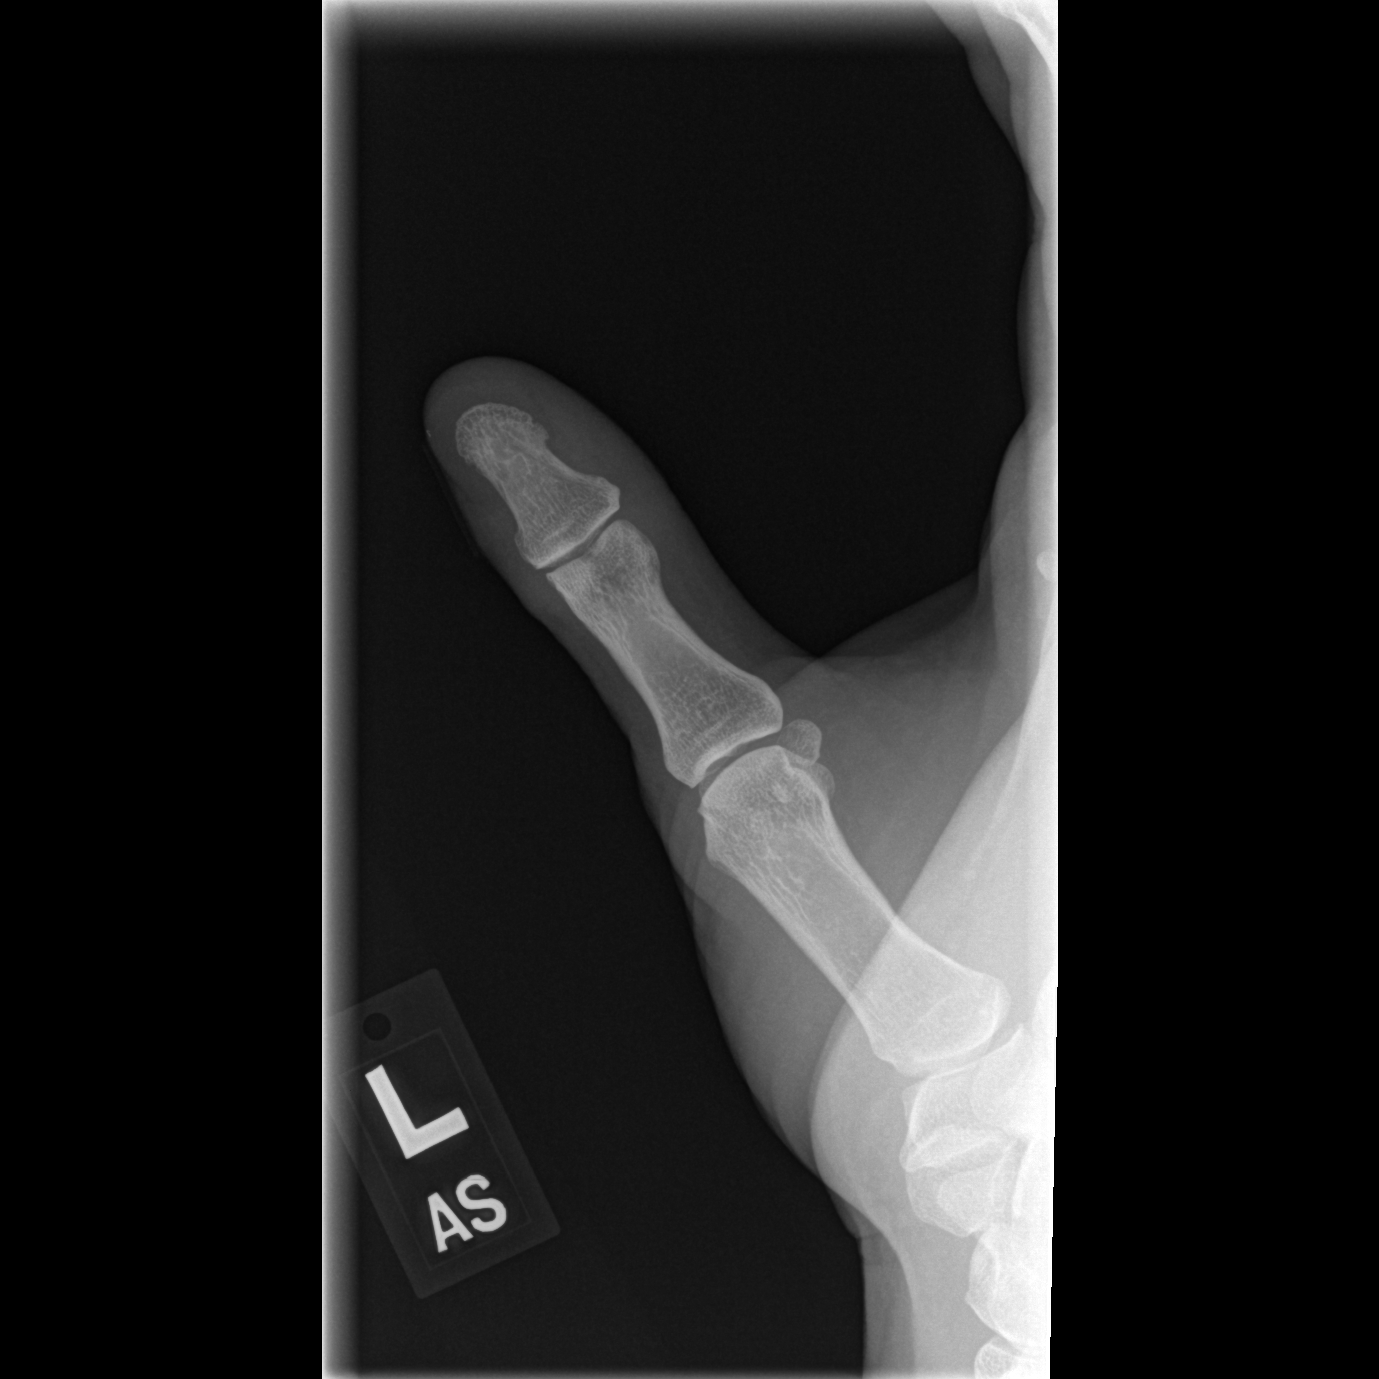

[x finger obl. left]
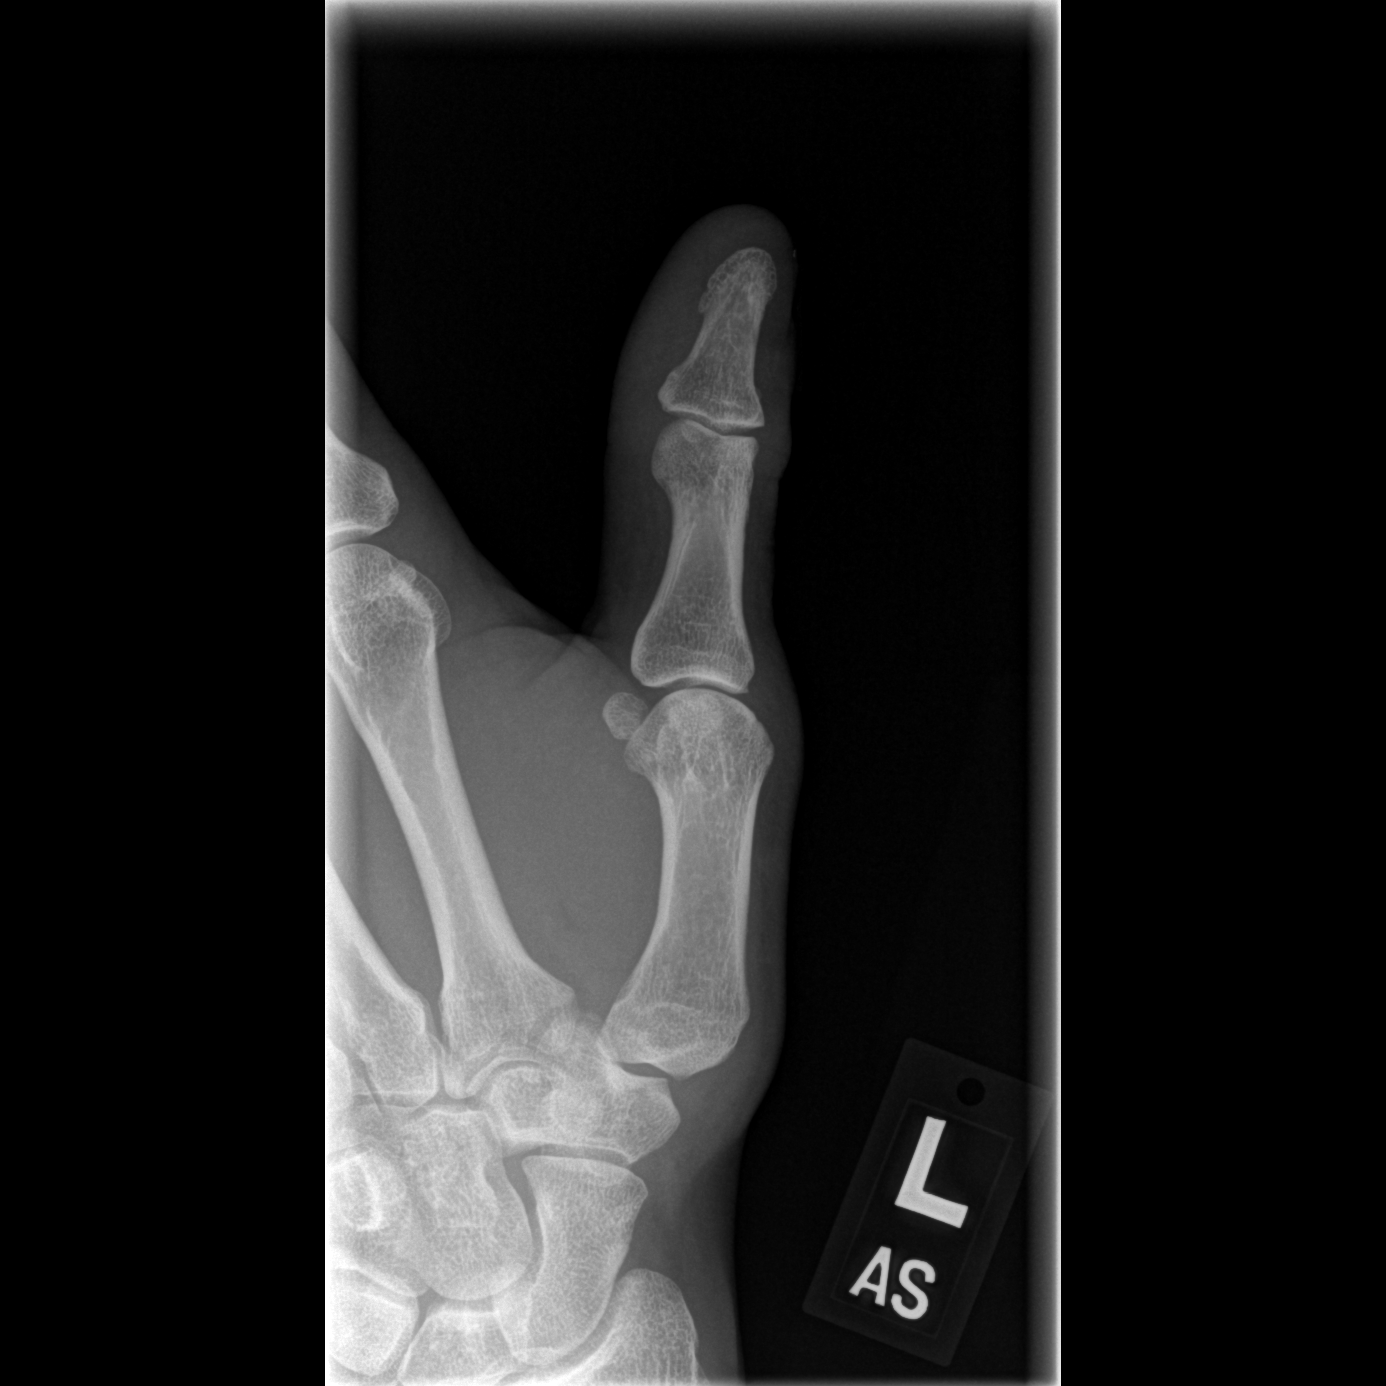

[x finger lateral left]
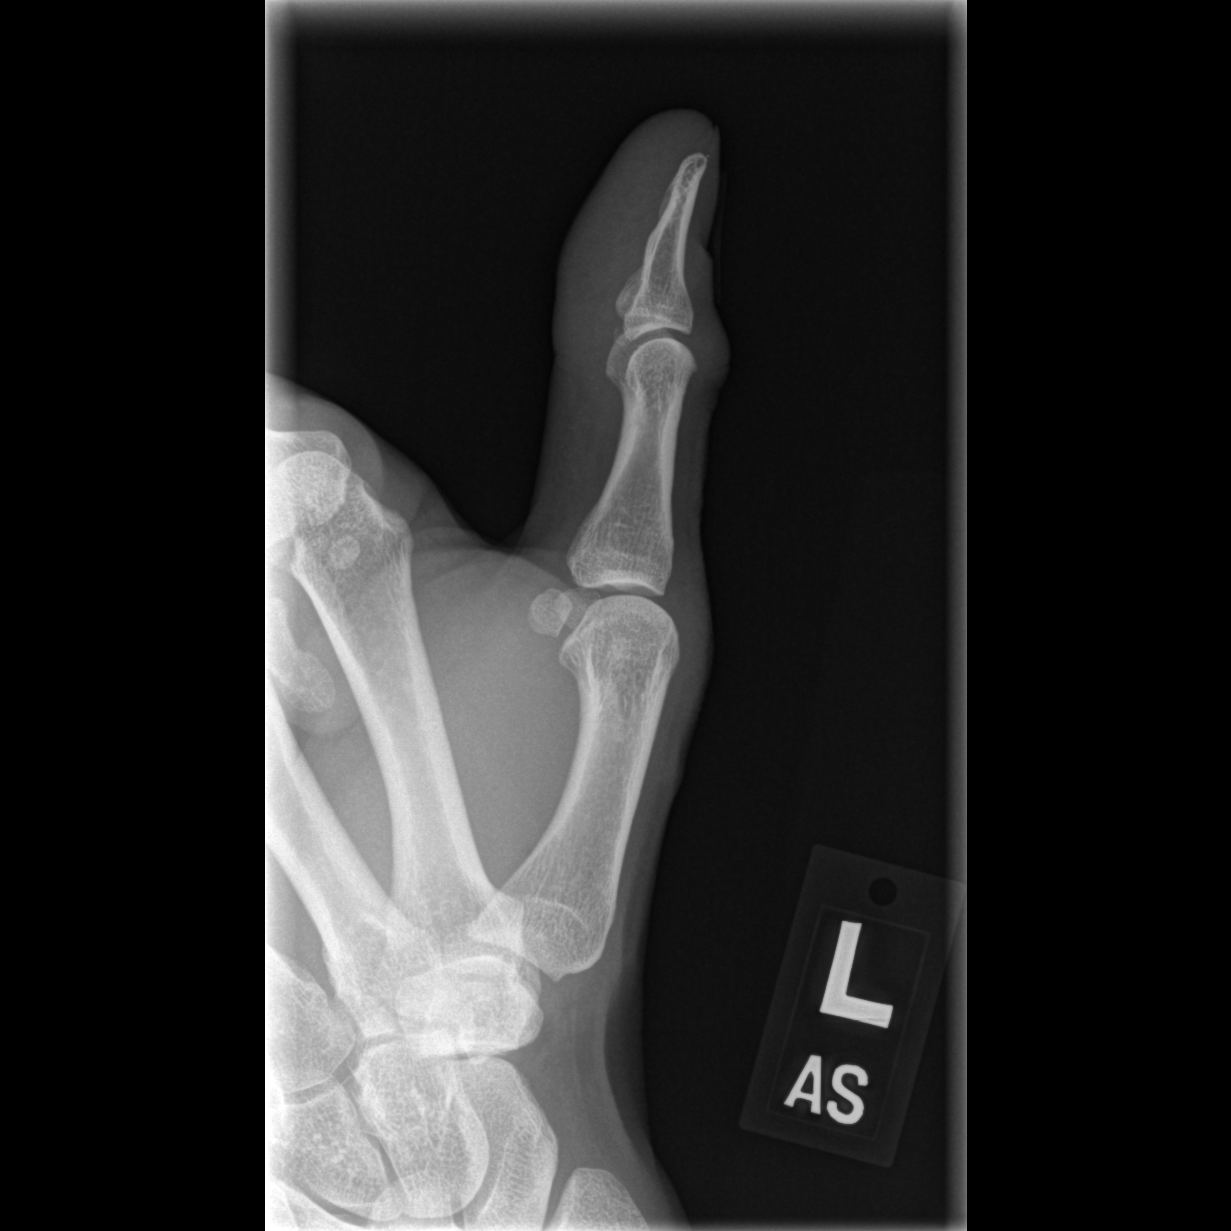

[3 of 3 positions shown; findings below may reference images not displayed]

FINDINGS: There is no evidence of fracture or dislocation. There is no
evidence of arthropathy or other focal bone abnormality. Soft
tissues are unremarkable.
IMPRESSION: No acute abnormality noted.

## 2023-10-13 DIAGNOSIS — J020 Streptococcal pharyngitis: Secondary | ICD-10-CM | POA: Diagnosis not present

## 2023-10-13 DIAGNOSIS — R030 Elevated blood-pressure reading, without diagnosis of hypertension: Secondary | ICD-10-CM | POA: Diagnosis not present

## 2023-10-13 DIAGNOSIS — R0981 Nasal congestion: Secondary | ICD-10-CM | POA: Diagnosis not present

## 2023-10-13 DIAGNOSIS — Z6826 Body mass index (BMI) 26.0-26.9, adult: Secondary | ICD-10-CM | POA: Diagnosis not present

## 2023-12-16 ENCOUNTER — Ambulatory Visit: Payer: BC Managed Care – PPO | Admitting: Family Medicine

## 2023-12-16 ENCOUNTER — Encounter: Payer: Self-pay | Admitting: Family Medicine

## 2023-12-16 VITALS — BP 122/76 | HR 70 | Temp 97.5°F | Ht 79.0 in | Wt 230.0 lb

## 2023-12-16 DIAGNOSIS — K6289 Other specified diseases of anus and rectum: Secondary | ICD-10-CM

## 2023-12-16 MED ORDER — HYDROCORTISONE ACETATE 25 MG RE SUPP: 25.0000 mg | Freq: Two times a day (BID) | RECTAL | 0 refills | Status: DC | PRN

## 2023-12-16 MED ORDER — LIDOCAINE-HYDROCORT (PERIANAL) 3-0.5 % EX CREA: TOPICAL_CREAM | CUTANEOUS | 0 refills | Status: DC

## 2023-12-16 NOTE — Patient Instructions (Addendum)
Medications as prescribed.  Referral placed to GI.  Take care  Dr. Adriana Simas

## 2023-12-18 DIAGNOSIS — K6289 Other specified diseases of anus and rectum: Secondary | ICD-10-CM | POA: Insufficient documentation

## 2023-12-18 NOTE — Assessment & Plan Note (Signed)
Rectal exam unremarkable today.  Trial of lidocaine/hydrocortisone and hydrocortisone suppositories if needed.  Referring to GI.  Recommend anoscopy.

## 2023-12-18 NOTE — Progress Notes (Signed)
Subjective:  Patient ID: Todd Ford, male    DOB: 09-21-1986  Age: 38 y.o. MRN: [id number]  CC:   Chief Complaint  Patient presents with   Hemorrhoids    Getting worse for past few weeks, some bleeding when using bathroom a lot. Worse at night causing pain, itching at night and causing erections.    Back Pain    Also having right back pain on and off for years. Concerned for kidney stones.      HPI:  38 year old male presents for evaluation of the above.  Patient reports that he is having rectal pain for the past 2 to 3 weeks.  He describes it as a pressure sensation.  Seems to be worse at night.  He has had some blood with wiping but very minimal.  Has used prior Rx for Anusol without relief.  Patient Active Problem List   Diagnosis Date Noted   Rectal pain 12/18/2023   Paroxysmal atrial fibrillation (HCC) 09/13/2022    Social Hx   Social History   Socioeconomic History   Marital status: Single    Spouse name: Not on file   Number of children: 0   Years of education: Not on file   Highest education level: Not on file  Occupational History   Occupation: electrician  Tobacco Use   Smoking status: Never   Smokeless tobacco: Never   Tobacco comments:    Never smoke 09/13/22  Substance and Sexual Activity   Alcohol use: Yes    Alcohol/week: 1.0 standard drink of alcohol    Types: 1 Standard drinks or equivalent per week    Comment: 1 drink weekly 09/13/22   Drug use: No   Sexual activity: Not on file  Other Topics Concern   Not on file  Social History Narrative   Not on file   Social Drivers of Health   Financial Resource Strain: Not on file  Food Insecurity: Not on file  Transportation Needs: Not on file  Physical Activity: Not on file  Stress: Not on file  Social Connections: Not on file    Review of Systems Per HPI  Objective:  BP 122/76   Pulse 70   Temp (!) 97.5 F (36.4 C)   Ht 6\' 7"  (2.007 m)   Wt 230 lb (104.3 kg)   SpO2 97%   BMI  25.91 kg/m      12/16/2023    8:33 AM 03/16/2023    4:04 PM 12/08/2022    2:42 PM  BP/Weight  Systolic BP 122 112 125  Diastolic BP 76 68 76  Wt. (Lbs) 230 227 231.2  BMI 25.91 kg/m2 25.57 kg/m2 26.05 kg/m2    Physical Exam Vitals and nursing note reviewed.  Constitutional:      General: He is not in acute distress.    Appearance: Normal appearance.  HENT:     Head: Normocephalic and atraumatic.  Pulmonary:     Effort: Pulmonary effort is normal. No respiratory distress.  Genitourinary:    Rectum: No mass, anal fissure or external hemorrhoid.  Neurological:     Mental Status: He is alert.  Psychiatric:        Mood and Affect: Mood normal.        Behavior: Behavior normal.     Lab Results  Component Value Date   WBC 7.3 08/26/2022   HGB 16.1 08/26/2022   HCT 46.1 08/26/2022   PLT 261 08/26/2022   GLUCOSE 87 12/08/2022   CHOL 186 12/08/2022  TRIG 191 (H) 12/08/2022   HDL 39 (L) 12/08/2022   LDLCALC 113 (H) 12/08/2022   ALT 23 12/08/2022   AST 20 12/08/2022   NA 140 12/08/2022   K 3.9 12/08/2022   CL 99 12/08/2022   CREATININE 0.85 12/08/2022   BUN 13 12/08/2022   CO2 26 12/08/2022   TSH 1.360 12/16/2021     Assessment & Plan:   Problem List Items Addressed This Visit       Other   Rectal pain - Primary   Rectal exam unremarkable today.  Trial of lidocaine/hydrocortisone and hydrocortisone suppositories if needed.  Referring to GI.  Recommend anoscopy.      Relevant Orders   Ambulatory referral to Gastroenterology    Meds ordered this encounter  Medications   hydrocortisone (ANUSOL-HC) 25 MG suppository    Sig: Place 1 suppository (25 mg total) rectally 2 (two) times daily as needed for hemorrhoids or anal itching.    Dispense:  12 suppository    Refill:  0   Lidocaine-Hydrocort, Perianal, 3-0.5 % CREA    Sig: Apply as needed up to two times per day    Dispense:  28.3 g    Refill:  0    Follow-up: Recommended follow-up with Dr.  Debbora Lacrosse DO [facility]

## 2023-12-21 ENCOUNTER — Encounter: Payer: Self-pay | Admitting: Internal Medicine

## 2023-12-21 ENCOUNTER — Other Ambulatory Visit: Payer: Self-pay | Admitting: *Deleted

## 2023-12-21 ENCOUNTER — Encounter: Payer: Self-pay | Admitting: *Deleted

## 2023-12-21 ENCOUNTER — Ambulatory Visit (INDEPENDENT_AMBULATORY_CARE_PROVIDER_SITE_OTHER): Payer: BC Managed Care – PPO | Admitting: Internal Medicine

## 2023-12-21 VITALS — BP 128/77 | HR 86 | Temp 98.0°F | Ht 79.0 in | Wt 231.6 lb

## 2023-12-21 DIAGNOSIS — L290 Pruritus ani: Secondary | ICD-10-CM

## 2023-12-21 DIAGNOSIS — K625 Hemorrhage of anus and rectum: Secondary | ICD-10-CM | POA: Diagnosis not present

## 2023-12-21 DIAGNOSIS — R195 Other fecal abnormalities: Secondary | ICD-10-CM | POA: Diagnosis not present

## 2023-12-21 DIAGNOSIS — K6289 Other specified diseases of anus and rectum: Secondary | ICD-10-CM | POA: Diagnosis not present

## 2023-12-21 MED ORDER — PEG 3350-KCL-NA BICARB-NACL 420 G PO SOLR: 4000.0000 mL | Freq: Once | ORAL | 0 refills | Status: AC

## 2023-12-21 NOTE — H&P (View-Only) (Signed)
Primary Care Physician:  Babs Sciara, MD Primary Gastroenterologist:  Dr. Marletta Lor  Chief Complaint  Patient presents with   Rectal Pain    Referred for rectal pain. Having pressure and some bleeding.     HPI:   Todd Ford is a 38 y.o. male who presents to clinic today by referral from his PCP Dr. Adriana Simas for evaluation.  Patient states he has had issues for approximately 10 years that have progressively worsened over the past year.  Notes rectal pain and itching, primarily occurs at night.  States he will occasionally form an erection during sleep which then causes pain and pressure in his rectum as well.  Also notes feeling like a "marble" is stuck in his rectum at times.  Denies any protrusion.  No pain with bowel movements.  Occasional constipation.  Does note occasional rectal bleeding.  Also notes he will have what he describes as pus/mucus discharge from his rectum at times.  No abdominal pain.  No family history of colorectal malignancy.  No family history of inflammatory bowel disease.  No previous colonoscopy.  Past Medical History:  Diagnosis Date   Atrial fibrillation with rapid ventricular response (HCC) 02/07/2017   cardioverted in ED   Bronchitis    Chest wall pain    Elevated BP without diagnosis of hypertension    Hyperlipidemia    Rhinosinusitis     Past Surgical History:  Procedure Laterality Date   arm surgery     right, bone spur    Current Outpatient Medications  Medication Sig Dispense Refill   Ascorbic Acid (VITAMIN C PO) Take 1 tablet by mouth daily.     aspirin 325 MG tablet Take 325 mg by mouth daily as needed (HTN).     hydrocortisone (ANUSOL-HC) 2.5 % rectal cream Place 1 Application rectally 2 (two) times daily. 30 g 0   loratadine (CLARITIN) 10 MG tablet Take 10 mg by mouth daily.       Multiple Vitamin (MULTIVITAMIN WITH MINERALS) TABS tablet Take 1 tablet by mouth daily.     VITAMIN D PO Take by mouth. One daily      hydrocortisone (ANUSOL-HC) 25 MG suppository Place 1 suppository (25 mg total) rectally 2 (two) times daily as needed for hemorrhoids or anal itching. (Patient not taking: Reported on 12/21/2023) 12 suppository 0   Lidocaine-Hydrocort, Perianal, 3-0.5 % CREA Apply as needed up to two times per day (Patient not taking: Reported on 12/21/2023) 28.3 g 0   No current facility-administered medications for this visit.    Allergies as of 12/21/2023 - Review Complete 12/21/2023  Allergen Reaction Noted   Neosporin [neomycin-polymyxin-gramicidin]  03/29/2011   Penicillins Rash 03/29/2011    Family History  Problem Relation Age of Onset   Lung cancer Paternal Grandfather     Social History   Socioeconomic History   Marital status: Single    Spouse name: Not on file   Number of children: 0   Years of education: Not on file   Highest education level: Not on file  Occupational History   Occupation: electrician  Tobacco Use   Smoking status: Never   Smokeless tobacco: Never   Tobacco comments:    Never smoke 09/13/22  Substance and Sexual Activity   Alcohol use: Yes    Alcohol/week: 1.0 standard drink of alcohol    Types: 1 Standard drinks or equivalent per week    Comment: 1 drink weekly 09/13/22   Drug use: No   Sexual  activity: Not on file  Other Topics Concern   Not on file  Social History Narrative   Not on file   Social Drivers of Health   Financial Resource Strain: Not on file  Food Insecurity: Not on file  Transportation Needs: Not on file  Physical Activity: Not on file  Stress: Not on file  Social Connections: Not on file  Intimate Partner Violence: Not on file    Subjective: Review of Systems  Constitutional:  Negative for chills and fever.  HENT:  Negative for congestion and hearing loss.   Eyes:  Negative for blurred vision and double vision.  Respiratory:  Negative for cough and shortness of breath.   Cardiovascular:  Negative for chest pain and  palpitations.  Gastrointestinal:  Negative for abdominal pain, blood in stool, constipation, diarrhea, heartburn, melena and vomiting.       Rectal pain, itching Rectal bleeding  Genitourinary:  Negative for dysuria and urgency.  Musculoskeletal:  Negative for joint pain and myalgias.  Skin:  Negative for itching and rash.  Neurological:  Negative for dizziness and headaches.  Psychiatric/Behavioral:  Negative for depression. The patient is not nervous/anxious.   Rectal exam: Deferred by patient until time of colonoscopy    Objective: BP 128/77   Pulse 86   Temp 98 F (36.7 C) (Oral)   Ht 6\' 7"  (2.007 m)   Wt 231 lb 9.6 oz (105.1 kg)   BMI 26.09 kg/m  Physical Exam Constitutional:      Appearance: Normal appearance.  HENT:     Head: Normocephalic and atraumatic.  Eyes:     Extraocular Movements: Extraocular movements intact.     Conjunctiva/sclera: Conjunctivae normal.  Cardiovascular:     Rate and Rhythm: Normal rate and regular rhythm.  Pulmonary:     Effort: Pulmonary effort is normal.     Breath sounds: Normal breath sounds.  Abdominal:     General: Bowel sounds are normal.     Palpations: Abdomen is soft.  Musculoskeletal:        General: Normal range of motion.     Cervical back: Normal range of motion and neck supple.  Skin:    General: Skin is warm.  Neurological:     General: No focal deficit present.     Mental Status: He is alert and oriented to person, place, and time.  Psychiatric:        Mood and Affect: Mood normal.        Behavior: Behavior normal.      Assessment: *Rectal pain *Rectal itching *Rectal bleeding *Mucus in stool  Plan: Etiology of patient's symptoms unclear.  Not responding to supportive measures with Anusol, lidocaine.  Differential includes hemorrhoids, fissure, fistula, polyp, malignancy, underlying inflammatory bowel disease, proctitis or other.  Will schedule for colonoscopy.The risks including infection, bleed, or  perforation as well as benefits, limitations, alternatives and imponderables have been reviewed with the patient. Questions have been answered. All parties agreeable.  Further recommendations to follow.  Thank you Dr. Adriana Simas for the kind referral 12/21/2023 2:20 PM   Disclaimer: This note was dictated with voice recognition software. Similar sounding words can inadvertently be transcribed and may not be corrected upon review.

## 2023-12-21 NOTE — Progress Notes (Signed)
Primary Care Physician:  Babs Sciara, MD Primary Gastroenterologist:  Dr. Marletta Lor  Chief Complaint  Patient presents with   Rectal Pain    Referred for rectal pain. Having pressure and some bleeding.     HPI:   Todd Ford is a 38 y.o. male who presents to clinic today by referral from his PCP Dr. Adriana Simas for evaluation.  Patient states he has had issues for approximately 10 years that have progressively worsened over the past year.  Notes rectal pain and itching, primarily occurs at night.  States he will occasionally form an erection during sleep which then causes pain and pressure in his rectum as well.  Also notes feeling like a "marble" is stuck in his rectum at times.  Denies any protrusion.  No pain with bowel movements.  Occasional constipation.  Does note occasional rectal bleeding.  Also notes he will have what he describes as pus/mucus discharge from his rectum at times.  No abdominal pain.  No family history of colorectal malignancy.  No family history of inflammatory bowel disease.  No previous colonoscopy.  Past Medical History:  Diagnosis Date   Atrial fibrillation with rapid ventricular response (HCC) 02/07/2017   cardioverted in ED   Bronchitis    Chest wall pain    Elevated BP without diagnosis of hypertension    Hyperlipidemia    Rhinosinusitis     Past Surgical History:  Procedure Laterality Date   arm surgery     right, bone spur    Current Outpatient Medications  Medication Sig Dispense Refill   Ascorbic Acid (VITAMIN C PO) Take 1 tablet by mouth daily.     aspirin 325 MG tablet Take 325 mg by mouth daily as needed (HTN).     hydrocortisone (ANUSOL-HC) 2.5 % rectal cream Place 1 Application rectally 2 (two) times daily. 30 g 0   loratadine (CLARITIN) 10 MG tablet Take 10 mg by mouth daily.       Multiple Vitamin (MULTIVITAMIN WITH MINERALS) TABS tablet Take 1 tablet by mouth daily.     VITAMIN D PO Take by mouth. One daily      hydrocortisone (ANUSOL-HC) 25 MG suppository Place 1 suppository (25 mg total) rectally 2 (two) times daily as needed for hemorrhoids or anal itching. (Patient not taking: Reported on 12/21/2023) 12 suppository 0   Lidocaine-Hydrocort, Perianal, 3-0.5 % CREA Apply as needed up to two times per day (Patient not taking: Reported on 12/21/2023) 28.3 g 0   No current facility-administered medications for this visit.    Allergies as of 12/21/2023 - Review Complete 12/21/2023  Allergen Reaction Noted   Neosporin [neomycin-polymyxin-gramicidin]  03/29/2011   Penicillins Rash 03/29/2011    Family History  Problem Relation Age of Onset   Lung cancer Paternal Grandfather     Social History   Socioeconomic History   Marital status: Single    Spouse name: Not on file   Number of children: 0   Years of education: Not on file   Highest education level: Not on file  Occupational History   Occupation: electrician  Tobacco Use   Smoking status: Never   Smokeless tobacco: Never   Tobacco comments:    Never smoke 09/13/22  Substance and Sexual Activity   Alcohol use: Yes    Alcohol/week: 1.0 standard drink of alcohol    Types: 1 Standard drinks or equivalent per week    Comment: 1 drink weekly 09/13/22   Drug use: No   Sexual  activity: Not on file  Other Topics Concern   Not on file  Social History Narrative   Not on file   Social Drivers of Health   Financial Resource Strain: Not on file  Food Insecurity: Not on file  Transportation Needs: Not on file  Physical Activity: Not on file  Stress: Not on file  Social Connections: Not on file  Intimate Partner Violence: Not on file    Subjective: Review of Systems  Constitutional:  Negative for chills and fever.  HENT:  Negative for congestion and hearing loss.   Eyes:  Negative for blurred vision and double vision.  Respiratory:  Negative for cough and shortness of breath.   Cardiovascular:  Negative for chest pain and  palpitations.  Gastrointestinal:  Negative for abdominal pain, blood in stool, constipation, diarrhea, heartburn, melena and vomiting.       Rectal pain, itching Rectal bleeding  Genitourinary:  Negative for dysuria and urgency.  Musculoskeletal:  Negative for joint pain and myalgias.  Skin:  Negative for itching and rash.  Neurological:  Negative for dizziness and headaches.  Psychiatric/Behavioral:  Negative for depression. The patient is not nervous/anxious.   Rectal exam: Deferred by patient until time of colonoscopy    Objective: BP 128/77   Pulse 86   Temp 98 F (36.7 C) (Oral)   Ht 6\' 7"  (2.007 m)   Wt 231 lb 9.6 oz (105.1 kg)   BMI 26.09 kg/m  Physical Exam Constitutional:      Appearance: Normal appearance.  HENT:     Head: Normocephalic and atraumatic.  Eyes:     Extraocular Movements: Extraocular movements intact.     Conjunctiva/sclera: Conjunctivae normal.  Cardiovascular:     Rate and Rhythm: Normal rate and regular rhythm.  Pulmonary:     Effort: Pulmonary effort is normal.     Breath sounds: Normal breath sounds.  Abdominal:     General: Bowel sounds are normal.     Palpations: Abdomen is soft.  Musculoskeletal:        General: Normal range of motion.     Cervical back: Normal range of motion and neck supple.  Skin:    General: Skin is warm.  Neurological:     General: No focal deficit present.     Mental Status: He is alert and oriented to person, place, and time.  Psychiatric:        Mood and Affect: Mood normal.        Behavior: Behavior normal.      Assessment: *Rectal pain *Rectal itching *Rectal bleeding *Mucus in stool  Plan: Etiology of patient's symptoms unclear.  Not responding to supportive measures with Anusol, lidocaine.  Differential includes hemorrhoids, fissure, fistula, polyp, malignancy, underlying inflammatory bowel disease, proctitis or other.  Will schedule for colonoscopy.The risks including infection, bleed, or  perforation as well as benefits, limitations, alternatives and imponderables have been reviewed with the patient. Questions have been answered. All parties agreeable.  Further recommendations to follow.  Thank you Dr. Adriana Simas for the kind referral 12/21/2023 2:20 PM   Disclaimer: This note was dictated with voice recognition software. Similar sounding words can inadvertently be transcribed and may not be corrected upon review.

## 2023-12-21 NOTE — Patient Instructions (Addendum)
We will schedule you colonoscopy to further evaluate your rectal pain and itching as well as intermittent mucus and bleeding.  Further recommendations to follow.  It was very nice meeting you today.  Dr. Marletta Lor

## 2024-01-10 ENCOUNTER — Ambulatory Visit (HOSPITAL_COMMUNITY): Payer: Self-pay | Admitting: Anesthesiology

## 2024-01-10 ENCOUNTER — Encounter (HOSPITAL_COMMUNITY)
Admission: RE | Disposition: A | Discharge status: Home / Self Care | Payer: Self-pay | Source: Home / Self Care | Attending: Internal Medicine

## 2024-01-10 ENCOUNTER — Ambulatory Visit (HOSPITAL_COMMUNITY)
Admission: RE | Admit: 2024-01-10 | Discharge: 2024-01-10 | Disposition: A | Discharge status: Home / Self Care | Payer: BC Managed Care – PPO | Attending: Internal Medicine | Admitting: Internal Medicine

## 2024-01-10 ENCOUNTER — Encounter (HOSPITAL_COMMUNITY): Payer: Self-pay | Admitting: Internal Medicine

## 2024-01-10 ENCOUNTER — Other Ambulatory Visit: Payer: Self-pay

## 2024-01-10 DIAGNOSIS — K648 Other hemorrhoids: Secondary | ICD-10-CM | POA: Insufficient documentation

## 2024-01-10 DIAGNOSIS — K625 Hemorrhage of anus and rectum: Secondary | ICD-10-CM | POA: Insufficient documentation

## 2024-01-10 DIAGNOSIS — K6289 Other specified diseases of anus and rectum: Secondary | ICD-10-CM | POA: Diagnosis not present

## 2024-01-10 DIAGNOSIS — K644 Residual hemorrhoidal skin tags: Secondary | ICD-10-CM | POA: Diagnosis not present

## 2024-01-10 HISTORY — PX: COLONOSCOPY WITH PROPOFOL: SHX5780

## 2024-01-10 SURGERY — COLONOSCOPY WITH PROPOFOL
Anesthesia: General

## 2024-01-10 MED ORDER — LACTATED RINGERS IV SOLN: INTRAVENOUS | Status: DC

## 2024-01-10 MED ORDER — LACTATED RINGERS IV SOLN: INTRAVENOUS | Status: DC | PRN

## 2024-01-10 MED ORDER — PROPOFOL 10 MG/ML IV BOLUS
INTRAVENOUS | Status: DC | PRN
  Administered 2024-01-10 (×2): 100 mg via INTRAVENOUS
  Administered 2024-01-10: 150 mg via INTRAVENOUS

## 2024-01-10 MED ORDER — MEPERIDINE HCL 50 MG/ML IJ SOLN: 6.2500 mg | INTRAMUSCULAR | Status: DC | PRN

## 2024-01-10 MED ORDER — LIDOCAINE HCL (CARDIAC) PF 100 MG/5ML IV SOSY
PREFILLED_SYRINGE | INTRAVENOUS | Status: DC | PRN
  Administered 2024-01-10: 60 mg via INTRATRACHEAL

## 2024-01-10 MED ORDER — FENTANYL CITRATE (PF) 100 MCG/2ML IJ SOLN: 25.0000 ug | INTRAMUSCULAR | Status: DC | PRN

## 2024-01-10 NOTE — Interval H&P Note (Signed)
History and Physical Interval Note:  01/10/2024 11:42 AM  Todd Ford  has presented today for surgery, with the diagnosis of rectal bleeding, rectal pain, mucus in stool.  The various methods of treatment have been discussed with the patient and family. After consideration of risks, benefits and other options for treatment, the patient has consented to  Procedure(s) with comments: COLONOSCOPY WITH PROPOFOL (N/A) - 11:15am, asa 2, pt does not want to move as a surgical intervention.  The patient's history has been reviewed, patient examined, no change in status, stable for surgery.  I have reviewed the patient's chart and labs.  Questions were answered to the patient's satisfaction.     Lanelle Bal

## 2024-01-10 NOTE — Op Note (Signed)
[facility] Patient Name: Todd Ford Procedure Date: 01/10/2024 11:42 AM MRN: [id number] Date of Birth: Sep 18, 1986 Attending MD: Hennie Duos. Marletta Lor , [place], [id number] CSN: [id number] Age: 38 Admit Type: Outpatient Procedure:                Colonoscopy Indications:              Rectal bleeding, Rectal pain Providers:                Hennie Duos. Marletta Lor, DO, Crystal Page, Durwin Glaze Tech, Technician Referring MD:              Medicines:                See the Anesthesia note for documentation of the                            administered medications Complications:            No immediate complications. Estimated Blood Loss:     Estimated blood loss: none. Procedure:                Pre-Anesthesia Assessment:                           - The anesthesia plan was to use monitored                            anesthesia care (MAC).                           After obtaining informed consent, the colonoscope                            was passed under direct vision. Throughout the                            procedure, the patient's blood pressure, pulse, and                            oxygen saturations were monitored continuously. The                            [id number]-HQ190L ([id number]) scope was introduced through                            the anus and advanced to the the terminal ileum,                            with identification of the appendiceal orifice and                            IC valve. The colonoscopy was performed without                            difficulty. The patient tolerated the procedure  well. The quality of the bowel preparation was                            evaluated using the BBPS [facility] Bowel Preparation                            Scale) with scores of: Right Colon = 3, Transverse                            Colon = 3 and Left Colon = 3 (entire mucosa seen                            well with no residual  staining, small fragments of                            stool or opaque liquid). The total BBPS score                            equals 9. Scope In: 12:37:32 PM Scope Out: 12:47:28 PM Scope Withdrawal Time: 0 hours 7 minutes 16 seconds  Total Procedure Duration: 0 hours 9 minutes 56 seconds  Findings:      Hemorrhoids were found on perianal exam.      Non-bleeding internal hemorrhoids were found during retroflexion.      The terminal ileum appeared normal.      The entire examined colon appeared normal. Impression:               - Hemorrhoids found on perianal exam.                           - Non-bleeding internal hemorrhoids.                           - The examined portion of the ileum was normal.                           - The entire examined colon is normal.                           - No specimens collected. Moderate Sedation:      Per Anesthesia Care Recommendation:           - Patient has a contact number available for                            emergencies. The signs and symptoms of potential                            delayed complications were discussed with the                            patient. Return to normal activities tomorrow.                            Written discharge instructions were provided to  the                            patient.                           - Resume previous diet.                           - Continue present medications.                           - Repeat colonoscopy at age 51 or sooner if higher                            risk for screening purposes.                           - Return to GI clinic in 6 weeks. Procedure Code(s):        --- Professional ---                           [phone], Colonoscopy, flexible; diagnostic, including                            collection of specimen(s) by brushing or washing,                            when performed (separate procedure) Diagnosis Code(s):        --- Professional ---                            K64.8, Other hemorrhoids                           K62.5, Hemorrhage of anus and rectum                           K62.89, Other specified diseases of anus and rectum CPT copyright 2022 American Medical Association. All rights reserved. The codes documented in this report are preliminary and upon coder review may  be revised to meet current compliance requirements. Hennie Duos. Marletta Lor, DO Hennie Duos. Marletta Lor, DO 01/10/2024 12:56:08 PM This report has been signed electronically. Number of Addenda: 0

## 2024-01-10 NOTE — Transfer of Care (Signed)
Immediate Anesthesia Transfer of Care Note  Patient: Todd Ford  Procedure(s) Performed: COLONOSCOPY WITH PROPOFOL  Patient Location: Endoscopy Unit  Anesthesia Type:General  Level of Consciousness: drowsy  Airway & Oxygen Therapy: Patient Spontanous Breathing  Post-op Assessment: Report given to RN and Post -op Vital signs reviewed and stable  Post vital signs: Reviewed and stable  Last Vitals:  Vitals Value Taken Time  BP 95/46 01/10/24 1252  Temp 36.7 C 01/10/24 1252  Pulse 64 01/10/24 1252  Resp 15 01/10/24 1252  SpO2 98 % 01/10/24 1252    Last Pain:  Vitals:   01/10/24 1252  TempSrc: Axillary  PainSc:       Patients Stated Pain Goal: 7 (01/10/24 1104)  Complications: No notable events documented.

## 2024-01-10 NOTE — Discharge Instructions (Addendum)
  Colonoscopy Discharge Instructions  Read the instructions outlined below and refer to this sheet in the next few weeks. These discharge instructions provide you with general information on caring for yourself after you leave the hospital. Your doctor may also give you specific instructions. While your treatment has been planned according to the most current medical practices available, unavoidable complications occasionally occur.   ACTIVITY You may resume your regular activity, but move at a slower pace for the next 24 hours.  Take frequent rest periods for the next 24 hours.  Walking will help get rid of the air and reduce the bloated feeling in your belly (abdomen).  No driving for 24 hours (because of the medicine (anesthesia) used during the test).   Do not sign any important legal documents or operate any machinery for 24 hours (because of the anesthesia used during the test).  NUTRITION Drink plenty of fluids.  You may resume your normal diet as instructed by your doctor.  Begin with a light meal and progress to your normal diet. Heavy or fried foods are harder to digest and may make you feel sick to your stomach (nauseated).  Avoid alcoholic beverages for 24 hours or as instructed.  MEDICATIONS You may resume your normal medications unless your doctor tells you otherwise.  WHAT YOU CAN EXPECT TODAY Some feelings of bloating in the abdomen.  Passage of more gas than usual.  Spotting of blood in your stool or on the toilet paper.  IF YOU HAD POLYPS REMOVED DURING THE COLONOSCOPY: No aspirin products for 7 days or as instructed.  No alcohol for 7 days or as instructed.  Eat a soft diet for the next 24 hours.  FINDING OUT THE RESULTS OF YOUR TEST Not all test results are available during your visit. If your test results are not back during the visit, make an appointment with your caregiver to find out the results. Do not assume everything is normal if you have not heard from your  caregiver or the medical facility. It is important for you to follow up on all of your test results.  SEEK IMMEDIATE MEDICAL ATTENTION IF: You have more than a spotting of blood in your stool.  Your belly is swollen (abdominal distention).  You are nauseated or vomiting.  You have a temperature over 101.  You have abdominal pain or discomfort that is severe or gets worse throughout the day.   Overall, your colon appeared very healthy.  I did not see any active inflammation indicative of underlying inflammatory bowel disease such as Crohn's disease or ulcerative colitis throughout your colon or end portion of your small bowel.    I did not find any polyps or evidence of colon cancer.  I recommend repeating colonoscopy in 10 years for colon cancer screening purposes.    You do have external and internal hemorrhoids. I would recommend increasing fiber in your diet or adding OTC Benefiber/Metamucil. Be sure to drink at least 6 glasses of water daily.  Recommend trial of sitz bath's daily.   Follow-up with GI in 4 to 6 weeks. OFFICE TO CALL WITH FOLLOW UP    I hope you have a great rest of your week!  Hennie Duos. Marletta Lor, D.O. Gastroenterology and Hepatology [facility]

## 2024-01-10 NOTE — Anesthesia Procedure Notes (Signed)
Date/Time: 01/10/2024 12:31 PM  Performed by: Franco Nones, CRNAPre-anesthesia Checklist: Patient identified, Emergency Drugs available, Suction available, Timeout performed and Patient being monitored Patient Re-evaluated:Patient Re-evaluated prior to induction Oxygen Delivery Method: Nasal Cannula

## 2024-01-10 NOTE — Anesthesia Preprocedure Evaluation (Signed)
Anesthesia Evaluation  Patient identified by MRN, date of birth, ID band Patient awake    Reviewed: Allergy & Precautions, H&P , NPO status , Patient's Chart, lab work & pertinent test results  Airway Mallampati: II  TM Distance: >3 FB Neck ROM: Full    Dental no notable dental hx.    Pulmonary neg pulmonary ROS   Pulmonary exam normal breath sounds clear to auscultation       Cardiovascular negative cardio ROS Normal cardiovascular exam Rhythm:Regular Rate:Normal     Neuro/Psych negative neurological ROS  negative psych ROS   GI/Hepatic negative GI ROS, Neg liver ROS,,,  Endo/Other  negative endocrine ROS    Renal/GU negative Renal ROS  negative genitourinary   Musculoskeletal negative musculoskeletal ROS (+)    Abdominal Normal abdominal exam  (+)   Peds negative pediatric ROS (+)  Hematology negative hematology ROS (+)   Anesthesia Other Findings   Reproductive/Obstetrics negative OB ROS                             Anesthesia Physical Anesthesia Plan  ASA: 2  Anesthesia Plan: General   Post-op Pain Management: Minimal or no pain anticipated   Induction: Intravenous  PONV Risk Score and Plan: 1 and Propofol infusion  Airway Management Planned: Simple Face Mask  Additional Equipment:   Intra-op Plan:   Post-operative Plan:   Informed Consent: I have reviewed the patients History and Physical, chart, labs and discussed the procedure including the risks, benefits and alternatives for the proposed anesthesia with the patient or authorized representative who has indicated his/her understanding and acceptance.       Plan Discussed with: CRNA  Anesthesia Plan Comments:        Anesthesia Quick Evaluation

## 2024-01-10 NOTE — Anesthesia Postprocedure Evaluation (Signed)
Anesthesia Post Note  Patient: Todd Ford  Procedure(s) Performed: COLONOSCOPY WITH PROPOFOL  Patient location during evaluation: PACU Anesthesia Type: General Level of consciousness: awake and alert Pain management: pain level controlled Vital Signs Assessment: post-procedure vital signs reviewed and stable Respiratory status: spontaneous breathing, nonlabored ventilation, respiratory function stable and patient connected to nasal cannula oxygen Cardiovascular status: blood pressure returned to baseline and stable Postop Assessment: no apparent nausea or vomiting Anesthetic complications: no   No notable events documented.   Last Vitals:  Vitals:   01/10/24 1258 01/10/24 1302  BP: (!) 91/47 (!) 94/50  Pulse: 61 61  Resp: 14 15  Temp:    SpO2: 98% 100%    Last Pain:  Vitals:   01/10/24 1302  TempSrc:   PainSc: 0-No pain                 Roslynn Amble

## 2024-01-11 ENCOUNTER — Encounter (HOSPITAL_COMMUNITY): Payer: Self-pay | Admitting: Internal Medicine

## 2024-01-24 ENCOUNTER — Ambulatory Visit
Admission: EM | Admit: 2024-01-24 | Discharge: 2024-01-24 | Disposition: A | Discharge status: Home / Self Care | Payer: BC Managed Care – PPO | Attending: Nurse Practitioner | Admitting: Nurse Practitioner

## 2024-01-24 ENCOUNTER — Encounter: Payer: Self-pay | Admitting: Emergency Medicine

## 2024-01-24 DIAGNOSIS — H6992 Unspecified Eustachian tube disorder, left ear: Secondary | ICD-10-CM

## 2024-01-24 MED ORDER — FLUTICASONE PROPIONATE 50 MCG/ACT NA SUSP: 2.0000 | Freq: Every day | NASAL | 0 refills | Status: DC

## 2024-01-24 MED ORDER — PREDNISONE 20 MG PO TABS: 40.0000 mg | ORAL_TABLET | Freq: Every day | ORAL | 0 refills | Status: AC

## 2024-01-24 NOTE — ED Provider Notes (Signed)
 [facility]    CSN: [id number] Arrival date & time: 01/24/24  1650      History   Chief Complaint No chief complaint on file.   HPI Todd Ford is a 38 y.o. male.   The history is provided by the patient.   Patient presents with a 2-day history of left ear pain and popping and discomfort in the left jaw.  Patient denies fever, chills, ear drainage, nasal congestion, runny nose, injury, or trauma.  Patient states that from time to time he can hear a "popping and crackling" noise in his ear.  He states when he is chewing, he can also hear the sound in his ear.  States that sometimes he can feel the pain radiate down into the left jaw.  Patient reports prior history of seasonal allergies.  States that he takes Claritin daily.  Past Medical History:  Diagnosis Date   Atrial fibrillation with rapid ventricular response (HCC) 02/07/2017   cardioverted in ED   Bronchitis    Chest wall pain    Elevated BP without diagnosis of hypertension    Hyperlipidemia    Rhinosinusitis     Patient Active Problem List   Diagnosis Date Noted   Rectal pain 12/18/2023   Paroxysmal atrial fibrillation (HCC) 09/13/2022    Past Surgical History:  Procedure Laterality Date   arm surgery     right, bone spur   CARDIOVERSION     COLONOSCOPY WITH PROPOFOL N/A 01/10/2024   Procedure: COLONOSCOPY WITH PROPOFOL;  Surgeon: Lanelle Bal, DO;  Location: [facility];  Service: Endoscopy;  Laterality: N/A;  11:15am, asa 2, pt does not want to move   CYST EXCISION         Home Medications    Prior to Admission medications   Medication Sig Start Date End Date Taking? Authorizing Provider  fluticasone (FLONASE) 50 MCG/ACT nasal spray Place 2 sprays into both nostrils daily. 01/24/24  Yes Leath-Warren, Sadie Haber, NP  predniSONE (DELTASONE) 20 MG tablet Take 2 tablets (40 mg total) by mouth daily with breakfast for 5 days. 01/24/24 01/29/24 Yes Leath-Warren, Sadie Haber, NP  Ascorbic  Acid (VITAMIN C PO) Take 1 tablet by mouth daily.    [provider]  aspirin 325 MG tablet Take 325 mg by mouth daily as needed (HTN).    [provider]  hydrocortisone (ANUSOL-HC) 2.5 % rectal cream Place 1 Application rectally 2 (two) times daily. 03/16/23   Tommie Sams, DO  Lidocaine-Hydrocort, Perianal, 3-0.5 % CREA Apply as needed up to two times per day Patient not taking: Reported on 12/21/2023 12/16/23   Tommie Sams, DO  loratadine (CLARITIN) 10 MG tablet Take 10 mg by mouth daily.      [provider]  magnesium 30 MG tablet Take 30 mg by mouth 2 (two) times daily.    [provider]  Multiple Vitamin (MULTIVITAMIN WITH MINERALS) TABS tablet Take 1 tablet by mouth daily.    [provider]  VITAMIN D PO Take by mouth. One daily    [provider]    Family History Family History  Problem Relation Age of Onset   Lung cancer Paternal Grandfather     Social History Social History   Tobacco Use   Smoking status: Never   Smokeless tobacco: Never   Tobacco comments:    Never smoke 09/13/22  Vaping Use   Vaping status: Never Used  Substance Use Topics   Alcohol use: Yes  Alcohol/week: 1.0 standard drink of alcohol    Types: 1 Standard drinks or equivalent per week    Comment: 1 drink weekly 09/13/22   Drug use: No     Allergies   Neosporin [neomycin-polymyxin-gramicidin] and Penicillins   Review of Systems Review of Systems Per HPI  Physical Exam Triage Vital Signs ED Triage Vitals  Encounter Vitals Group     BP 01/24/24 1656 127/80     Systolic BP Percentile --      Diastolic BP Percentile --      Pulse Rate 01/24/24 1656 68     Resp 01/24/24 1656 18     Temp 01/24/24 1656 98 F (36.7 C)     Temp Source 01/24/24 1656 Oral     SpO2 01/24/24 1656 95 %     Weight --      Height --      Head Circumference --      Peak Flow --      Pain Score 01/24/24 1658 3     Pain Loc --      Pain Education --       Exclude from Growth Chart --    No data found.  Updated Vital Signs BP 127/80 (BP Location: Right Arm)   Pulse 68   Temp 98 F (36.7 C) (Oral)   Resp 18   SpO2 95%   Visual Acuity Right Eye Distance:   Left Eye Distance:   Bilateral Distance:    Right Eye Near:   Left Eye Near:    Bilateral Near:     Physical Exam Vitals and nursing note reviewed.  Constitutional:      General: He is not in acute distress.    Appearance: Normal appearance.  HENT:     Head: Normocephalic.     Right Ear: Ear canal and external ear normal.     Left Ear: Ear canal and external ear normal. A middle ear effusion is present.     Nose: Nose normal.     Mouth/Throat:     Mouth: Mucous membranes are moist.  Eyes:     Extraocular Movements: Extraocular movements intact.     Conjunctiva/sclera: Conjunctivae normal.     Pupils: Pupils are equal, round, and reactive to light.  Cardiovascular:     Rate and Rhythm: Normal rate and regular rhythm.     Pulses: Normal pulses.     Heart sounds: Normal heart sounds.  Pulmonary:     Effort: Pulmonary effort is normal.     Breath sounds: Normal breath sounds.  Abdominal:     General: Bowel sounds are normal.     Palpations: Abdomen is soft.  Musculoskeletal:     Cervical back: Normal range of motion.  Neurological:     General: No focal deficit present.     Mental Status: He is alert and oriented to person, place, and time.  Psychiatric:        Mood and Affect: Mood normal.        Behavior: Behavior normal.      UC Treatments / Results  Labs (all labs ordered are listed, but only abnormal results are displayed) Labs Reviewed - No data to display  EKG   Radiology No results found.  Procedures Procedures (including critical care time)  Medications Ordered in UC Medications - No data to display  Initial Impression / Assessment and Plan / UC Course  I have reviewed the triage vital signs and the nursing notes.  Pertinent labs  & imaging results that were available during my care of the patient were reviewed by me and considered in my medical decision making (see chart for details).  On exam, lung sounds are clear throughout, room air sats at 95%.  Patient with left middle ear effusion, most likely causing his symptoms.  Symptoms are consistent with a left eustachian tube dysfunction.  Patient is currently taking Claritin, will treat with fluticasone 50 mcg nasal spray and prednisone 40 mg for inflammation and swelling of the eustachian tubes.  Supportive care recommendations were provided and discussed with the patient to include fluids, rest, and warm compresses to the ear.  Discussed indications for follow-up.  Patient was in agreement with this plan of care and verbalizes understanding.  All questions were answered.  Patient stable for discharge.  Final Clinical Impressions(s) / UC Diagnoses   Final diagnoses:  Acute dysfunction of left eustachian tube     Discharge Instructions      Take medication as prescribed. May take over-the-counter Tylenol or ibuprofen as needed for pain or discomfort.   Warm compresses to the affected ear help with comfort. Do not stick anything inside the ear while symptoms persist. Avoid getting water inside of the ear while symptoms persist. As discussed, if symptoms fail to improve with this treatment, please follow-up with your primary care physician or with ENT for further evaluation. Follow-up as needed.     ED Prescriptions     Medication Sig Dispense Auth. Provider   predniSONE (DELTASONE) 20 MG tablet Take 2 tablets (40 mg total) by mouth daily with breakfast for 5 days. 10 tablet Leath-Warren, Sadie Haber, NP   fluticasone (FLONASE) 50 MCG/ACT nasal spray Place 2 sprays into both nostrils daily. 16 g Leath-Warren, Sadie Haber, NP      PDMP not reviewed this encounter.   Abran Cantor, NP 01/24/24 1715

## 2024-01-24 NOTE — Discharge Instructions (Signed)
 Take medication as prescribed. May take over-the-counter Tylenol or ibuprofen as needed for pain or discomfort.   Warm compresses to the affected ear help with comfort. Do not stick anything inside the ear while symptoms persist. Avoid getting water inside of the ear while symptoms persist. As discussed, if symptoms fail to improve with this treatment, please follow-up with your primary care physician or with ENT for further evaluation. Follow-up as needed.

## 2024-01-24 NOTE — ED Triage Notes (Signed)
 Left ear pain and popping feeling in jaw x 2 days.

## 2024-02-07 ENCOUNTER — Ambulatory Visit: Payer: Self-pay | Admitting: Family Medicine

## 2024-02-07 NOTE — Telephone Encounter (Signed)
 Chief Complaint: R flank pain Symptoms: R flank pain, nausea Frequency: yesterday Pertinent Negatives: Patient denies fever, vomiting Disposition: [] ED /[] Urgent Care (no appt availability in office) / [x] Appointment(In office/virtual)/ []  [facility]/ [] Home Care/ [] Refused Recommended Disposition /[] [facility] [facility]/ []  Follow-up with PCP Additional Notes: Pt reports R flank pain since yesterday. Pt states the pain is near his lower back/end of his rib cage on his R side and to his abdomen. Pt states it feels like he has a "balloon" inside that area. Pt believes he has a kidney stone but has no hx of those. Denies urinary symptoms. Denies hematuria. Endorses nausea but has not vomited. Denies fever. Denies weakness. Able to urinate normally. Per protocol RN advised pt he should be seen within 24 hours. RN scheduled pt for tomorrow 3/12 at 1610. RN advised pt he needs to call us back with any worsening and he verbalized understanding.   Copied from CRM [id number]. Topic: Clinical - Medical Advice >> Feb 07, 2024  4:12 PM Teressa P wrote: Reason for CRM: pt has pain like he may have a kidney stone.  Also Nauseated. Reason for Disposition  MODERATE pain (e.g., interferes with normal activities or awakens from sleep)  Answer Assessment - Initial Assessment Questions 1. LOCATION: "Where does it hurt?" (e.g., left, right)     R flank 2. ONSET: "When did the pain start?"     "I've had it this bad, before but it went away", states pain started yesterday 3. SEVERITY: "How bad is the pain?" (e.g., Scale 1-10; mild, moderate, or severe)   - MILD (1-3): doesn't interfere with normal activities    - MODERATE (4-7): interferes with normal activities or awakens from sleep    - SEVERE (8-10): excruciating pain and patient unable to do normal activities (stays in bed)       "Yesterday was a 5-6/10, it was pretty painful", "Today it is like a 3-4/10" 4. PATTERN: "Does the pain come and go, or  is it constant?"      Comes and goes 5. CAUSE: "What do you think is causing the pain?"     Possibly a kidney stone 6. OTHER SYMPTOMS:  "Do you have any other symptoms?" (e.g., fever, abdomen pain, vomiting, leg weakness, burning with urination, blood in urine)     Endorses nausea (no vomiting), no dysuria, "I try to stay hydrated", "urine stays on the lighter side", has not noticed blood in his urine, "I wouldn't say I had a fever, I had the sweats a little bit", "I felt weak yesterday", tired yesterday, no weakness in the legs, endorses abdominal pain ("states it feels like he has a balloon in his belly/right flank")  Protocols used: Flank Pain-A-AH

## 2024-02-07 NOTE — Telephone Encounter (Signed)
 Noted.

## 2024-02-08 ENCOUNTER — Ambulatory Visit: Admitting: Family Medicine

## 2024-02-08 VITALS — BP 110/78 | HR 66 | Temp 98.4°F | Ht 79.0 in | Wt 230.0 lb

## 2024-02-08 DIAGNOSIS — R1011 Right upper quadrant pain: Secondary | ICD-10-CM

## 2024-02-08 DIAGNOSIS — R109 Unspecified abdominal pain: Secondary | ICD-10-CM

## 2024-02-08 LAB — POCT URINALYSIS DIP (CLINITEK)
Bilirubin, UA: NEGATIVE
Blood, UA: NEGATIVE
Glucose, UA: NEGATIVE mg/dL
Ketones, POC UA: NEGATIVE mg/dL
Leukocytes, UA: NEGATIVE
Nitrite, UA: NEGATIVE
POC PROTEIN,UA: NEGATIVE
Spec Grav, UA: 1.01 (ref 1.010–1.025)
Urobilinogen, UA: 0.2 U/dL
pH, UA: 7 (ref 5.0–8.0)

## 2024-02-08 MED ORDER — DICLOFENAC SODIUM 75 MG PO TBEC: 75.0000 mg | DELAYED_RELEASE_TABLET | Freq: Two times a day (BID) | ORAL | 0 refills | Status: DC | PRN

## 2024-02-08 NOTE — Patient Instructions (Signed)
 Labs today.  Arranging [place]. We will call and set up.  Take care  Dr. Adriana Simas

## 2024-02-09 ENCOUNTER — Encounter: Payer: Self-pay | Admitting: Family Medicine

## 2024-02-09 DIAGNOSIS — R1011 Right upper quadrant pain: Secondary | ICD-10-CM | POA: Insufficient documentation

## 2024-02-09 LAB — CBC
Hematocrit: 45.1 % (ref 37.5–51.0)
Hemoglobin: 15.3 g/dL (ref 13.0–17.7)
MCH: 30.2 pg (ref 26.6–33.0)
MCHC: 33.9 g/dL (ref 31.5–35.7)
MCV: 89 fL (ref 79–97)
Platelets: 270 10*3/uL (ref 150–450)
RBC: 5.06 x10E6/uL (ref 4.14–5.80)
RDW: 13.1 % (ref 11.6–15.4)
WBC: 6.8 10*3/uL (ref 3.4–10.8)

## 2024-02-09 LAB — CMP14+EGFR
ALT: 26 IU/L (ref 0–44)
AST: 22 IU/L (ref 0–40)
Albumin: 4.9 g/dL (ref 4.1–5.1)
Alkaline Phosphatase: 76 IU/L (ref 44–121)
BUN/Creatinine Ratio: 12 (ref 9–20)
BUN: 12 mg/dL (ref 6–24)
Bilirubin Total: 0.3 mg/dL (ref 0.0–1.2)
CO2: 26 mmol/L (ref 20–29)
Calcium: 9.4 mg/dL (ref 8.7–10.2)
Chloride: 101 mmol/L (ref 96–106)
Creatinine, Ser: 1.03 mg/dL (ref 0.76–1.27)
Globulin, Total: 2.4 g/dL (ref 1.5–4.5)
Glucose: 92 mg/dL (ref 70–99)
Potassium: 4.2 mmol/L (ref 3.5–5.2)
Sodium: 140 mmol/L (ref 134–144)
Total Protein: 7.3 g/dL (ref 6.0–8.5)
eGFR: 94 mL/min/{1.73_m2} (ref >59)

## 2024-02-09 LAB — LIPASE: Lipase: 38 U/L (ref 13–78)

## 2024-02-09 NOTE — Progress Notes (Signed)
 Subjective:  Patient ID: Todd Ford, male    DOB: [birth year]  Age: 38 y.o. MRN: [id number]  CC:   Chief Complaint  Patient presents with   Flank Pain    Flank pain x's 3 days    HPI:  38 year old male presents for evaluation of the above.  Patient reports right upper quadrant pain and pain in the right upper back.  Has been going on for the past 3 days.  He has had this remotely in the past.  He states that it seems to occur predominantly after he eats.  Pain is intermittent.  No fever.  No relieving factors.  Patient Active Problem List   Diagnosis Date Noted   RUQ pain 02/09/2024   Paroxysmal atrial fibrillation (HCC) 09/13/2022    Social Hx   Social History   Socioeconomic History   Marital status: Single    Spouse name: Not on file   Number of children: 0   Years of education: Not on file   Highest education level: Not on file  Occupational History   Occupation: electrician  Tobacco Use   Smoking status: Never   Smokeless tobacco: Never   Tobacco comments:    Never smoke 09/13/22  Vaping Use   Vaping status: Never Used  Substance and Sexual Activity   Alcohol use: Yes    Alcohol/week: 1.0 standard drink of alcohol    Types: 1 Standard drinks or equivalent per week    Comment: 1 drink weekly 09/13/22   Drug use: No   Sexual activity: Not on file  Other Topics Concern   Not on file  Social History Narrative   Not on file   Social Drivers of Health   Financial Resource Strain: Not on file  Food Insecurity: Not on file  Transportation Needs: Not on file  Physical Activity: Not on file  Stress: Not on file  Social Connections: Not on file    Review of Systems Per HPI  Objective:  BP 110/78   Pulse 66   Temp 98.4 F (36.9 C)   Ht 6\' 7"  (2.007 m)   Wt 230 lb (104.3 kg)   SpO2 95%   BMI 25.91 kg/m      02/08/2024    4:04 PM 01/24/2024    4:56 PM 01/10/2024    1:02 PM  BP/Weight  Systolic BP 110 127 94  Diastolic BP 78 80 50  Wt.  (Lbs) 230    BMI 25.91 kg/m2      Physical Exam Vitals and nursing note reviewed.  Constitutional:      General: He is not in acute distress.    Appearance: Normal appearance.  HENT:     Head: Normocephalic and atraumatic.  Cardiovascular:     Rate and Rhythm: Normal rate and regular rhythm.  Pulmonary:     Effort: Pulmonary effort is normal.     Breath sounds: Normal breath sounds. No wheezing or rales.  Abdominal:     General: There is no distension.     Palpations: Abdomen is soft.     Tenderness: There is no right CVA tenderness, left CVA tenderness or guarding.     Comments: No significant tenderness on exam.  Neurological:     Mental Status: He is alert.     Lab Results  Component Value Date   WBC 6.8 02/08/2024   HGB 15.3 02/08/2024   HCT 45.1 02/08/2024   PLT 270 02/08/2024   GLUCOSE 92 02/08/2024   CHOL 186  12/08/2022   TRIG 191 (H) 12/08/2022   HDL 39 (L) 12/08/2022   LDLCALC 113 (H) 12/08/2022   ALT 26 02/08/2024   AST 22 02/08/2024   NA 140 02/08/2024   K 4.2 02/08/2024   CL 101 02/08/2024   CREATININE 1.03 02/08/2024   BUN 12 02/08/2024   CO2 26 02/08/2024   TSH 1.360 12/16/2021     Assessment & Plan:  RUQ pain Assessment & Plan: Patient with reports of right upper quadrant pain and pain of the right upper back.  Urinalysis was clear.  This is not consistent with nephrolithiasis.  Given that is worse with meals, concerned that this is secondary to issues in the biliary tract.  Labs today.  Arranging for ultrasound.  Advised healthy diet.  Diclofenac as needed for pain.  Orders: -     POCT URINALYSIS DIP (CLINITEK) -     CBC -     CMP14+EGFR -     Lipase -     US ABDOMEN LIMITED RUQ (LIVER/GB)  Other orders -     Diclofenac Sodium; Take 1 tablet (75 mg total) by mouth 2 (two) times daily as needed for moderate pain (pain score 4-6).  Dispense: 30 tablet; Refill: 0    Follow-up: Pending workup.  Everlene Other DO [facility]

## 2024-02-09 NOTE — Assessment & Plan Note (Addendum)
 Patient with reports of right upper quadrant pain and pain of the right upper back.  Urinalysis was clear.  This is not consistent with nephrolithiasis.  Given that is worse with meals, concerned that this is secondary to issues in the biliary tract.  Labs today.  Arranging for ultrasound.  Advised healthy diet.  Diclofenac as needed for pain.

## 2024-02-20 NOTE — Progress Notes (Unsigned)
 GI Office Note    Referring Provider: Babs Sciara, MD Primary Care Physician:  Babs Sciara, MD  Primary Gastroenterologist: Hennie Duos. Marletta Lor, DO   Chief Complaint   No chief complaint on file.   History of Present Illness   Todd Ford is a 37 y.o. male presenting today     Colonoscopy 12/2023: -Hemorrhoids found on perianal exam -Nonbleeding internal hemorrhoids -Examined portion of ileum normal -Examined colon normal     Medications   Current Outpatient Medications  Medication Sig Dispense Refill   Ascorbic Acid (VITAMIN C PO) Take 1 tablet by mouth daily.     diclofenac (VOLTAREN) 75 MG EC tablet Take 1 tablet (75 mg total) by mouth 2 (two) times daily as needed for moderate pain (pain score 4-6). 30 tablet 0   fluticasone (FLONASE) 50 MCG/ACT nasal spray Place 2 sprays into both nostrils daily. 16 g 0   hydrocortisone (ANUSOL-HC) 2.5 % rectal cream Place 1 Application rectally 2 (two) times daily. 30 g 0   Lidocaine-Hydrocort, Perianal, 3-0.5 % CREA Apply as needed up to two times per day 28.3 g 0   loratadine (CLARITIN) 10 MG tablet Take 10 mg by mouth daily.       magnesium 30 MG tablet Take 30 mg by mouth 2 (two) times daily.     Multiple Vitamin (MULTIVITAMIN WITH MINERALS) TABS tablet Take 1 tablet by mouth daily.     VITAMIN D PO Take by mouth. One daily     No current facility-administered medications for this visit.    Allergies   Allergies as of 02/21/2024 - Review Complete 01/24/2024  Allergen Reaction Noted   Neosporin [neomycin-polymyxin-gramicidin]  03/29/2011   Penicillins Rash 03/29/2011     Past Medical History   Past Medical History:  Diagnosis Date   Atrial fibrillation with rapid ventricular response (HCC) 02/07/2017   cardioverted in ED   Bronchitis    Chest wall pain    Elevated BP without diagnosis of hypertension    Hyperlipidemia    Rhinosinusitis     Past Surgical History   Past Surgical History:   Procedure Laterality Date   arm surgery     right, bone spur   CARDIOVERSION     COLONOSCOPY WITH PROPOFOL N/A 01/10/2024   Procedure: COLONOSCOPY WITH PROPOFOL;  Surgeon: Lanelle Bal, DO;  Location: [facility];  Service: Endoscopy;  Laterality: N/A;  11:15am, asa 2, pt does not want to move   CYST EXCISION      Past Family History   Family History  Problem Relation Age of Onset   Lung cancer Paternal Grandfather     Past Social History   Social History   Socioeconomic History   Marital status: Single    Spouse name: Not on file   Number of children: 0   Years of education: Not on file   Highest education level: Not on file  Occupational History   Occupation: electrician  Tobacco Use   Smoking status: Never   Smokeless tobacco: Never   Tobacco comments:    Never smoke 09/13/22  Vaping Use   Vaping status: Never Used  Substance and Sexual Activity   Alcohol use: Yes    Alcohol/week: 1.0 standard drink of alcohol    Types: 1 Standard drinks or equivalent per week    Comment: 1 drink weekly 09/13/22   Drug use: No   Sexual activity: Not on file  Other Topics Concern  Not on file  Social History Narrative   Not on file   Social Drivers of Health   Financial Resource Strain: Not on file  Food Insecurity: Not on file  Transportation Needs: Not on file  Physical Activity: Not on file  Stress: Not on file  Social Connections: Not on file  Intimate Partner Violence: Not on file    Review of Systems   General: Negative for anorexia, weight loss, fever, chills, fatigue, weakness. ENT: Negative for hoarseness, difficulty swallowing , nasal congestion. CV: Negative for chest pain, angina, palpitations, dyspnea on exertion, peripheral edema.  Respiratory: Negative for dyspnea at rest, dyspnea on exertion, cough, sputum, wheezing.  GI: See history of present illness. GU:  Negative for dysuria, hematuria, urinary incontinence, urinary frequency, nocturnal  urination.  Endo: Negative for unusual weight change.     Physical Exam   There were no vitals taken for this visit.   General: Well-nourished, well-developed in no acute distress.  Eyes: No icterus. Mouth: Oropharyngeal mucosa moist and pink , no lesions erythema or exudate. Lungs: Clear to auscultation bilaterally.  Heart: Regular rate and rhythm, no murmurs rubs or gallops.  Abdomen: Bowel sounds are normal, nontender, nondistended, no hepatosplenomegaly or masses,  no abdominal bruits or hernia , no rebound or guarding.  Rectal: ***  Extremities: No lower extremity edema. No clubbing or deformities. Neuro: Alert and oriented x 4   Skin: Warm and dry, no jaundice.   Psych: Alert and cooperative, normal mood and affect.  Labs   Lab Results  Component Value Date   LIPASE 38 02/08/2024   Lab Results  Component Value Date   NA 140 02/08/2024   CL 101 02/08/2024   K 4.2 02/08/2024   CO2 26 02/08/2024   BUN 12 02/08/2024   CREATININE 1.03 02/08/2024   EGFR 94 02/08/2024   CALCIUM 9.4 02/08/2024   ALBUMIN 4.9 02/08/2024   GLUCOSE 92 02/08/2024   Lab Results  Component Value Date   ALT 26 02/08/2024   AST 22 02/08/2024   ALKPHOS 76 02/08/2024   BILITOT 0.3 02/08/2024   Lab Results  Component Value Date   WBC 6.8 02/08/2024   HGB 15.3 02/08/2024   HCT 45.1 02/08/2024   MCV 89 02/08/2024   PLT 270 02/08/2024    Imaging Studies   No results found.  Assessment       PLAN   ***   Leanna Battles. Melvyn Neth, MHS, PA-C [facility]

## 2024-02-21 ENCOUNTER — Encounter: Payer: Self-pay | Admitting: Gastroenterology

## 2024-02-21 ENCOUNTER — Ambulatory Visit: Payer: BC Managed Care – PPO | Admitting: Gastroenterology

## 2024-02-21 VITALS — BP 129/83 | HR 65 | Temp 98.1°F | Ht 79.0 in | Wt 232.6 lb

## 2024-02-21 DIAGNOSIS — R198 Other specified symptoms and signs involving the digestive system and abdomen: Secondary | ICD-10-CM | POA: Insufficient documentation

## 2024-02-21 DIAGNOSIS — K6289 Other specified diseases of anus and rectum: Secondary | ICD-10-CM

## 2024-02-24 ENCOUNTER — Other Ambulatory Visit: Payer: Self-pay | Admitting: *Deleted

## 2024-02-24 ENCOUNTER — Ambulatory Visit (HOSPITAL_COMMUNITY)
Admission: RE | Admit: 2024-02-24 | Discharge: 2024-02-24 | Disposition: A | Discharge status: Home / Self Care | Source: Ambulatory Visit | Attending: Family Medicine | Admitting: Family Medicine

## 2024-02-24 ENCOUNTER — Telehealth: Payer: Self-pay | Admitting: Gastroenterology

## 2024-02-24 ENCOUNTER — Encounter: Payer: Self-pay | Admitting: Family Medicine

## 2024-02-24 DIAGNOSIS — K603 Anal fistula, unspecified: Secondary | ICD-10-CM

## 2024-02-24 DIAGNOSIS — R1011 Right upper quadrant pain: Secondary | ICD-10-CM | POA: Diagnosis not present

## 2024-02-24 NOTE — Telephone Encounter (Signed)
 Pt insurance approved for [facility], would not approve [facility]. Called [facility] and they will reach out to pt to get him scheduled for MRI.

## 2024-02-24 NOTE — Addendum Note (Signed)
 Addended by: Elinor Dodge on: 02/24/2024 10:06 AM   Modules accepted: Orders

## 2024-02-24 NOTE — Telephone Encounter (Signed)
 Pt is wanting to go ahead and schedule the MRI while waiting on [facility].

## 2024-02-24 NOTE — Telephone Encounter (Signed)
 Referral sent to Dr.Gross [facility]

## 2024-02-24 NOTE — Telephone Encounter (Signed)
 Please let patient know that I discussed exam findings with Dr. Marletta Lor. Suspect he has fistula likely related to perirectal abscess in the past.   He likely will need surgical intervention. We can send him to colorectal surgeon, Dr. Michaell Cowing at [facility] and let him decide on what imaging work up if any that he needs or we can start work up here with MRI pelvis with and without contrast to determine extent of fistula while waiting for him to see surgery. Let me know which he prefers.

## 2024-02-24 NOTE — Telephone Encounter (Signed)
 Lmom for pt to return call

## 2024-02-24 NOTE — Telephone Encounter (Signed)
 [facility] PA for MRI: auth # [id number] DOS:  02/24/24-03/24/24

## 2024-03-12 ENCOUNTER — Ambulatory Visit
Admission: RE | Admit: 2024-03-12 | Discharge: 2024-03-12 | Disposition: A | Discharge status: Home / Self Care | Source: Ambulatory Visit | Attending: Gastroenterology | Admitting: Gastroenterology

## 2024-03-12 DIAGNOSIS — K603 Anal fistula, unspecified: Secondary | ICD-10-CM

## 2024-03-12 DIAGNOSIS — K6289 Other specified diseases of anus and rectum: Secondary | ICD-10-CM | POA: Diagnosis not present

## 2024-03-12 MED ORDER — GADOPICLENOL 0.5 MMOL/ML IV SOLN
10.0000 mL | Freq: Once | INTRAVENOUS | Status: AC | PRN
  Administered 2024-03-12: 10 mL via INTRAVENOUS

## 2024-03-14 ENCOUNTER — Other Ambulatory Visit

## 2024-03-26 ENCOUNTER — Ambulatory Visit: Payer: Self-pay | Admitting: Surgery

## 2024-03-26 DIAGNOSIS — K603 Anal fistula, unspecified: Secondary | ICD-10-CM | POA: Diagnosis not present

## 2024-03-26 DIAGNOSIS — Z8719 Personal history of other diseases of the digestive system: Secondary | ICD-10-CM | POA: Diagnosis not present

## 2024-03-26 DIAGNOSIS — K641 Second degree hemorrhoids: Secondary | ICD-10-CM | POA: Diagnosis not present

## 2024-03-26 NOTE — Progress Notes (Signed)
REFERRING PHYSICIAN:  Ezzard Raring, PA  Patient Care Team: Alphonsa Glendia Sayre, MD as PCP - General (Family Medicine)  PROVIDER:  [name] JUDAH SCHULTZE, MD  [facility] MRN: [id number] DOB: [birth year]   SUBJECTIVE   Chief Complaint: New Consultation    Todd Ford is a 38 y.o. male  who is seen today as an office consultation  at the request of DrRONITA Hasty for evaluation of perianal drainage question of fistula.  History of Present Illness:  38 year old male.  10-year history of rectal pain and itching.  Felt a marblelike sensation back there with pain and discomfort.  Feels rectal pain if he gets erections.  Some occasional puslike drainage.  Underwent colonoscopy 01/09/2022 for rectal bleeding.  Colon and ileum normal.  Some mild internal hemorrhoids noted.  Some external hemorrhoids as well.  Recurrent episodes of intermittent purulent drainage.  Fistula suspected.  MRI done which showed question of incomplete perirectal tract but not enter/transsphincteric.  Some concern of colitis/proctitis.  Surgical consultation offered.  Patient comes in by himself.  He claims he moves his bowels about once a day.  No bouts of constipation or diarrhea.  He does feel some burning and pressure and bleeding.  Feels like he pushes across the lump if his stools little harder.  Can be painful.  Wakes him up every other month and drainage lasted several days.  Calms down but comes back.  Does not smoke.  No sleep apnea.  No diabetes.  He had a sebaceous cyst removed in his right lower abdomen but no other subcutaneous issues.  No right prior thrombosed hemorrhoid lancing or drainage of a perirectal abscess.  No personal family history of inflammatory bowel disease or Crohn's.  Can walk several miles without difficulty.  Gets up to urinate maybe once a night but no difficulty starting or maintaining stream.  No prostatism.    Medical History:  History reviewed. No pertinent past medical  history.  Patient Active Problem List  Diagnosis  . Anal fistula  . Prolapsed internal hemorrhoids, grade 2  . History of rectal bleeding    Past Surgical History:  Procedure Laterality Date  . Arm surgery    . cyst remvoal       Allergies  Allergen Reactions  . Neomycin-Polymyxin-Gramicidin Other (See Comments)    Wound never heals it gets infected  . Penicillins Rash    Current Outpatient Medications on File Prior to Visit  Medication Sig Dispense Refill  . ascorbic acid (VITA-C ORAL) Take by mouth    . cholecalciferol 1000 unit tablet Take by mouth    . loratadine (CLARITIN) 10 mg tablet Take 10 mg by mouth once daily    . multivitamin tablet Take 1 tablet by mouth once daily     No current facility-administered medications on file prior to visit.    History reviewed. No pertinent family history.   Social History   Tobacco Use  Smoking Status Never  Smokeless Tobacco Never     Social History   Socioeconomic History  . Marital status: Married  Tobacco Use  . Smoking status: Never  . Smokeless tobacco: Never  Substance and Sexual Activity  . Alcohol use: Never  . Drug use: Never   Social Drivers of Health   Housing Stability: Unknown (03/26/2024)   Housing Stability Vital Sign   . Homeless in the Last Year: No    ############################################################  Review of Systems: A complete review of systems (ROS) was obtained from the patient.  We have reviewed this information and discussed as appropriate with the patient.   See HPI as well for other pertinent ROS.  Constitutional:  No fevers, chills, sweats.  Weight stable Eyes:  No vision changes, No discharge HENT:  No sore throats, nasal drainage Lymph: No neck swelling, No bruising easily Pulmonary:  No cough, productive sputum CV: No orthopnea, PND . No exertional chest/neck/shoulder/arm pain.  Patient can walk >5 miles without difficulty.    GI:  No personal nor family  history of GI/colon cancer, inflammatory bowel disease, irritable bowel syndrome, allergy such as Celiac Sprue, dietary/dairy problems, colitis, ulcers nor gastritis.  No recent sick contacts/gastroenteritis.  No travel outside the country.  No changes in diet.  Renal: No UTIs, No hematuria Genital:  No drainage, bleeding, masses Musculoskeletal: No severe joint pain.   Good ROM major joints Skin:  No sores or lesions Heme/Lymph:  No easy bleeding.  No swollen lymph nodes Neuro:  No active seizures.  No facial droop Psych:  No hallucinations.  No agitation  OBJECTIVE   Vitals:   03/26/24 1521  BP: (!) 154/77  Pulse: 72  Temp: 37.3 C (99.2 F)  SpO2: 99%  Weight: (!) 105.7 kg (233 lb)  Height: 200.7 cm (6' 7)  PainSc: 0-No pain  PainLoc: Rectum    Body mass index is 26.25 kg/m.  PHYSICAL EXAM:   Constitutional: Not cachectic.  Hygeine adequate.  Vitals signs as above.   Eyes: No glasses.  Vision adequate,Pupils reactive, normal extraocular movements. Sclera nonicteric Neuro: CN II-XII intact.  No major focal sensory defects.  No major motor deficits. Lymph: No head/neck/groin lymphadenopathy Psych:  No severe agitation.  No severe anxiety.  Judgment & insight Adequate, Oriented x4, HENT: Normocephalic, Mucus membranes moist.  No thrush.  Hearing: adequate Neck: Supple, No tracheal deviation.  No obvious thyromegaly Chest: No pain to chest wall compression.  Good respiratory excursion.  No audible wheezing CV:  Pulses intact.  regular.  No major extremity edema Ext: No obvious deformity or contracture.  Edema: Not present.  No cyanosis Skin: No major subcutaneous nodules.  Warm and dry Musculoskeletal: Severe joint rigidity not present.  No obvious clubbing.  No digital petechiae.  Mobility: no assist device moving easily without restrictions  Abdomen:  Flat Soft.   Nondistended.  Nontender.    Hernia: Not present. Diastasis recti: Not present.  No hepatomegaly.  No  splenomegaly.  Genital/Pelvic:  Inguinal hernia: Not present.  Inguinal lymph nodes: without lymphadenopathy nor hidradenitis.    Rectal:  Perianal skin Clean with good hygiene  Pruritis ani: Mild Pilonidal disease: Not present  External hemorrhoids: Not present Condyloma / warts: Not present Anal fissure: Not present Perirectal abscess/fistula: Right anterior subcutaneous cord could be felt from right anterior anal verge towards right anterior perirectal region.  Cannot feel any definite sinus opening. Sphincter tone: Normal but some spasming.  Digital and anoscopic rectal exam: Barely tolerated   Hemorrhoidal piles: Grade 2 internal at left posterior lateral with some inflammation.  Right posterior grade 1.  Right anterior grade 2. Prostate:  Normal size & no nodularity Rectovaginal septum: N/A Rectal masses: Not present  Other significant findings: No strong evidence of any anal cryptitis, anal crypt polyp, internal opening.  Patient examined  with patient in decubitus position .  ###################################      ###################################################################  Labs, Imaging and Diagnostic Testing:  Located in 'Care Everywhere' section of Epic EMR chart   [facility] NOTES:  Not applicable  SURGERY NOTES:  Not applicable   PATHOLOGY:  Not applicable    Assessment and Plan:  DIAGNOSES:  Diagnoses and all orders for this visit:  Anal fistula  Prolapsed internal hemorrhoids, grade 2  History of rectal bleeding     ASSESSMENT/PLAN  Intermittent anorectal pain and drainage and bleeding for 10 years without any other obvious source.  Exam and MRI concerning for anorectal fistula although possibly incomplete.  Because he has had symptoms intermittently for the past 10 years without any other good explanation, I think he would benefit from anorectal examination under esthesia.  Probable excision of fistula tract.  More  aggressive intervention to rule out intersphincteric or transsphincteric fistula.  Given opportunity to do hemorrhoidal ligation & pexy.  Outpatient surgery.  He feels like he has been tortured with this for 10 years and he would like to be aggressive and agrees with outpatient surgery in the hopes of fixing the problem The anatomy & physiology of the anorectal region was discussed.  We discussed the pathophysiology of anorectal abscess and fistula.  Differential diagnosis was discussed.  Natural history progression was discussed.   I stressed the importance of a bowel regimen to have daily soft bowel movements to minimize progression of disease.     The patient's condition is not adequately controlled.  Non-operative treatment has not healed the fistula.  Therefore, I recommended examination under anaesthesia to confirm the diagnosis and treat the fistula.  I discussed techniques that may be required such as fistulotomy, ligation by LIFT technique, and/or seton placement.  Benefits & alternatives discussed.  I noted a good likelihood this will help address the problem, but sometimes repeat operations and prolonged healing times may occur.  Risks such as bleeding, pain, recurrence, reoperation, injury to other organs, need for repair of tissues / organs reoperation, incontinence, heart attack, death, and other risks were discussed.      Educational handouts further explaining the pathology, treatment options, and bowel regimen were given.  The patient expressed understanding & wishes to proceed.  We will work to coordinate surgery for a mutually convenient time.    FOLLOWUP:  Return for WE RECOMMEND SURGERY - See instructions.  I have reviewed this patient's available data, including medical history, doctor notes, radiology & other studies, events of note, test results, physical exam, etc as part of my evaluation.   A significant portion of that time was spent in counseling in discussion of management,  need for further testing, need for other medical or surgical input, etc.   Care was provided by me.  Based on my assessment, this care required LOW- Level 3 level of medical decision making.  03/26/2024   The plan was discussed in detail with the patient today, who expressed understanding & appreciation.  The patient has my contact information, and understands to call me with any additional questions or concerns.  I & my group would be happy to see the patient back sooner if the need arises.  Of note, portions of this report may have been transcribed using voice recognition software. Effort was made to ensure accuracy; however, inadvertent computerized transcription errors with sound-a-like substitutions may be present.   Any transcriptional errors that result from this process are unintentional.  ########################################################   [name] KYM Schultze, MD, FACS, MASCRS Esophageal, Gastrointestinal & Colorectal Surgery Robotic and Minimally Invasive Surgery  [facility]  [address] [place], [place] [address] [phone] Fax [phone] Main  03/26/2024    

## 2024-05-30 NOTE — Progress Notes (Signed)
Forms completed and emailed to patient

## 2024-06-08 DIAGNOSIS — J019 Acute sinusitis, unspecified: Secondary | ICD-10-CM | POA: Diagnosis not present

## 2024-06-08 DIAGNOSIS — Z6825 Body mass index (BMI) 25.0-25.9, adult: Secondary | ICD-10-CM | POA: Diagnosis not present

## 2024-06-08 DIAGNOSIS — E663 Overweight: Secondary | ICD-10-CM | POA: Diagnosis not present

## 2024-06-26 NOTE — Patient Instructions (Signed)
 SURGICAL WAITING ROOM VISITATION Patients having surgery or a procedure may have no more than 2 support people in the waiting area - these visitors may rotate in the visitor waiting room.   If the patient needs to stay at the hospital during part of their recovery, the visitor guidelines for inpatient rooms apply.  PRE-OP VISITATION  Pre-op nurse will coordinate an appropriate time for 1 support person to accompany the patient in pre-op.  This support person may not rotate.  This visitor will be contacted when the time is appropriate for the visitor to come back in the pre-op area.  Please refer to the [facility] website for the visitor guidelines for Inpatients (after your surgery is over and you are in a regular room).  You are not required to quarantine at this time prior to your surgery. However, you must do this: Hand Hygiene often Do NOT share personal items Notify your provider if you are in close contact with someone who has COVID or you develop fever 100.4 or greater, new onset of sneezing, cough, sore throat, shortness of breath or body aches.  If you test positive for Covid or have been in contact with anyone that has tested positive in the last 10 days please notify you surgeon.    Your procedure is scheduled on:  Friday  July 06, 2024  Report to [facility] Main Entrance: [place] entrance where the [employer] is available.   Report to admitting at: 05:15  AM  Call this number if you have any questions or problems the morning of surgery [phone]  DO NOT EAT OR DRINK ANYTHING AFTER MIDNIGHT THE NIGHT PRIOR TO YOUR SURGERY / PROCEDURE.   FOLLOW  ANY ADDITIONAL PRE OP INSTRUCTIONS YOU RECEIVED FROM YOUR SURGEON'S OFFICE!!!  DAY PRIOR TO SURGERY:   Obtain a bottle of MIRALAX at a grocery store pharmacy  Switch to drinking liquids or pureed foods only  Drink plenty of liquids all day to avoid getting dehydrated   10:00am: Take 2 doses (34g) Miralax.    2:00pm:  Take 2 more doses (34g) of Miralax   Midnight:  Do not eat anything solid  ####################################  MORNING OF SURGERY   Remember to not to eat anything solid that morning  Hold or take medications as recommended by the hospital staff at your Preoperative visit   Stop drinking liquids before you leave the house (>3 hours prior to surgery)     Oral Hygiene is also important to reduce your risk of infection.        Remember - BRUSH YOUR TEETH THE MORNING OF SURGERY WITH YOUR REGULAR TOOTHPASTE  Do NOT smoke after Midnight the night before surgery.  STOP TAKING all Vitamins, Herbs and supplements 1 week before your surgery.   Take ONLY these medicines the morning of surgery with A SIP OF WATER:  none                   You may not have any metal on your body including  jewelry, and body piercing  Do not wear  lotions, powders, cologne, or deodorant  Men may shave face and neck.  Contacts, Hearing Aids, dentures or bridgework may not be worn into surgery. DENTURES WILL BE REMOVED PRIOR TO SURGERY PLEASE DO NOT APPLY Poly grip OR ADHESIVES!!!  Patients discharged on the day of surgery will not be allowed to drive home.  Someone NEEDS to stay with you for the first 24 hours after anesthesia.  Do not bring your home medications to the hospital. The Pharmacy will dispense medications listed on your medication list to you during your admission in the Hospital.   Please read over the following fact sheets you were given: IF YOU HAVE QUESTIONS ABOUT YOUR PRE-OP INSTRUCTIONS, PLEASE CALL [phone].   [place] - Preparing for Surgery Before surgery, you can play an important role.  Because skin is not sterile, your skin needs to be as free of germs as possible.  You can reduce the number of germs on your skin by washing with CHG (chlorahexidine gluconate) soap before surgery.  CHG is an antiseptic cleaner which kills germs and bonds with the skin to continue  killing germs even after washing. Please DO NOT use if you have an allergy to CHG or antibacterial soaps.  If your skin becomes reddened/irritated stop using the CHG and inform your nurse when you arrive at Short Stay. Do not shave (including legs and underarms) for at least 48 hours prior to the first CHG shower.  You may shave your face/neck.  Please follow these instructions carefully:  1.  Shower with CHG Soap the night before surgery and the  morning of surgery.  2.  If you choose to wash your hair, wash your hair first as usual with your normal  shampoo.  3.  After you shampoo, rinse your hair and body thoroughly to remove the shampoo.                             4.  Use CHG as you would any other liquid soap.  You can apply chg directly to the skin and wash.  Gently with a scrungie or clean washcloth.  5.  Apply the CHG Soap to your body ONLY FROM THE NECK DOWN.   Do not use on face/ open                           Wound or open sores. Avoid contact with eyes, ears mouth and genitals (private parts).                       Wash face,  Genitals (private parts) with your normal soap.             6.  Wash thoroughly, paying special attention to the area where your  surgery  will be performed.  7.  Thoroughly rinse your body with warm water from the neck down.  8.  DO NOT shower/wash with your normal soap after using and rinsing off the CHG Soap.            9.  Pat yourself dry with a clean towel.            10.  Wear clean pajamas.            11.  Place clean sheets on your bed the night of your first shower and do not  sleep with pets.  ON THE DAY OF SURGERY : Do not apply any lotions/deodorants the morning of surgery.  Please wear clean clothes to the hospital/surgery center.    FAILURE TO FOLLOW THESE INSTRUCTIONS MAY RESULT IN THE CANCELLATION OF YOUR SURGERY  PATIENT SIGNATURE_________________________________  NURSE  SIGNATURE__________________________________  ________________________________________________________________________

## 2024-06-26 NOTE — Progress Notes (Signed)
 COVID Vaccine received:  [x]  No []  Yes Date of any COVID positive Test in last 90 days:  none  PCP - Glendia Fielding, MD  Cardiologist -  EP - Eulas Furbish, MD   Chest x-ray - 08-26-2022  1v  Epic EKG - 09-29-2022   will repeat   Stress Test -  ECHO - 10-01-2022  Bubble study  Epic Cardiac Cath -  CT Coronary Calcium score:   Bowel Prep - []  No  [x]   Yes _Anal prep_____  Pacemaker / ICD device [x]  No []  Yes   Spinal Cord Stimulator:[x]  No []  Yes       History of Sleep Apnea? [x]  No []  Yes   CPAP used?- [x]  No []  Yes    Does the patient monitor blood sugar?   [x]  N/A   []  No []  Yes  Patient has: [x]  NO Hx DM   []  Pre-DM   []  DM1  []   DM2  Blood Thinner / Instructions:  none Aspirin Instructions:  none  ERAS Protocol Ordered: [x]  No  []  Yes Patient is to be NPO after: MN Prior  Dental hx: []  Dentures:  [x]  N/A      []  Bridge or Partial:                   []  Loose or Damaged teeth:   Activity level: Able to walk up 2 flights of stairs without becoming significantly short of breath or having chest pain?  []  No   [x]    Yes  Patient can perform ADLs without assistance. []  No   [x]   Yes  Anesthesia review: PAF- s/p cardioversion 02-07-2017, HTN  Patient denies any S&S of respiratory illness or Covid - no shortness of breath, fever, cough or chest pain at PAT appointment.  Patient verbalized understanding and agreement to the Pre-Surgical Instructions that were given to them at this PAT appointment. Patient was also educated of the need to review these PAT instructions again prior to his surgery.I reviewed the appropriate phone numbers to call if they have any and questions or concerns.

## 2024-06-28 ENCOUNTER — Encounter (HOSPITAL_COMMUNITY)
Admission: RE | Admit: 2024-06-28 | Discharge: 2024-06-28 | Disposition: A | Discharge status: Home / Self Care | Source: Ambulatory Visit | Attending: Surgery | Admitting: Surgery

## 2024-06-28 ENCOUNTER — Other Ambulatory Visit: Payer: Self-pay

## 2024-06-28 ENCOUNTER — Encounter (HOSPITAL_COMMUNITY): Payer: Self-pay

## 2024-06-28 VITALS — BP 119/67 | HR 72 | Temp 98.0°F | Resp 14 | Ht 79.0 in | Wt 212.0 lb

## 2024-06-28 DIAGNOSIS — K603 Anal fistula, unspecified: Secondary | ICD-10-CM | POA: Diagnosis not present

## 2024-06-28 DIAGNOSIS — I480 Paroxysmal atrial fibrillation: Secondary | ICD-10-CM | POA: Diagnosis not present

## 2024-06-28 DIAGNOSIS — I10 Essential (primary) hypertension: Secondary | ICD-10-CM | POA: Insufficient documentation

## 2024-06-28 DIAGNOSIS — K641 Second degree hemorrhoids: Secondary | ICD-10-CM | POA: Insufficient documentation

## 2024-06-28 DIAGNOSIS — Z01812 Encounter for preprocedural laboratory examination: Secondary | ICD-10-CM | POA: Diagnosis not present

## 2024-06-28 DIAGNOSIS — Z01810 Encounter for preprocedural cardiovascular examination: Secondary | ICD-10-CM | POA: Diagnosis not present

## 2024-06-28 DIAGNOSIS — I4891 Unspecified atrial fibrillation: Secondary | ICD-10-CM

## 2024-06-28 DIAGNOSIS — Z01818 Encounter for other preprocedural examination: Secondary | ICD-10-CM | POA: Diagnosis not present

## 2024-06-28 HISTORY — DX: Family history of other specified conditions: Z84.89

## 2024-06-28 HISTORY — DX: Cardiac arrhythmia, unspecified: I49.9

## 2024-06-28 HISTORY — DX: Essential (primary) hypertension: I10

## 2024-06-28 LAB — BASIC METABOLIC PANEL WITH GFR
Anion gap: 11 (ref 5–15)
BUN: 14 mg/dL (ref 6–20)
CO2: 24 mmol/L (ref 22–32)
Calcium: 9.3 mg/dL (ref 8.9–10.3)
Chloride: 102 mmol/L (ref 98–111)
Creatinine, Ser: 0.95 mg/dL (ref 0.61–1.24)
GFR, Estimated: >60 mL/min (ref >60)
Glucose, Bld: 104 mg/dL — ABNORMAL HIGH (ref 70–99)
Potassium: 3.8 mmol/L (ref 3.5–5.1)
Sodium: 137 mmol/L (ref 135–145)

## 2024-06-28 LAB — CBC
HCT: 43.2 % (ref 39.0–52.0)
Hemoglobin: 14.6 g/dL (ref 13.0–17.0)
MCH: 30.1 pg (ref 26.0–34.0)
MCHC: 33.8 g/dL (ref 30.0–36.0)
MCV: 89.1 fL (ref 80.0–100.0)
Platelets: 239 K/uL (ref 150–400)
RBC: 4.85 MIL/uL (ref 4.22–5.81)
RDW: 12.4 % (ref 11.5–15.5)
WBC: 6.1 K/uL (ref 4.0–10.5)
nRBC: 0 % (ref 0.0–0.2)

## 2024-06-29 NOTE — Progress Notes (Signed)
 Anesthesia Chart Review   Case: [id number] Date/Time: 07/06/24 0715   Procedures:      ANAL FISTULOTOMY - REPAIR OF PERIRECTAL FISTULA, POSSIBLE HEMORRHOIDECTOMY     EXAM UNDER ANESTHESIA, RECTUM     HEMORRHOIDECTOMY   Anesthesia type: General   Diagnosis:      Anal fistula [K60.30]     Prolapsed internal hemorrhoids, grade 2 [K64.1]   Pre-op diagnosis: PERIRECTAL FISTULA   Location: [facility] ROOM 02 / [facility]   Surgeons: Sheldon Standing, MD       DISCUSSION:38 y.o. never smoker with h/o HTN, atrial fibrillation, perirectal fistula scheduled for above procedure 07/06/2024 with Dr. Standing Sheldon.   Per cardio notes in 2023 pt with two episodes of AF 5 years apart. No indication for anticoagulation. Prescribed flecainide  and metoprolol  to take as needed if he experiences recurrence.  Pt advised to follow up with cardio prn.  VS: BP 119/67 Comment: right arm sitting  Pulse 72   Temp 36.7 C (Oral)   Resp 14   Ht 6' 7 (2.007 m)   Wt 96.2 kg   SpO2 98%   BMI 23.88 kg/m   PROVIDERS: Alphonsa Glendia LABOR, MD is PCP    LABS: Labs reviewed: Acceptable for surgery. (all labs ordered are listed, but only abnormal results are displayed)  Labs Reviewed  BASIC METABOLIC PANEL WITH GFR - Abnormal; Notable for the following components:      Result Value   Glucose, Bld 104 (*)    All other components within normal limits  CBC     IMAGES:   EKG:   CV: Echo 10/01/2022 1. Left ventricular ejection fraction, by estimation, is 60 to 65%. The  left ventricle has normal function. The left ventricle has no regional  wall motion abnormalities. Left ventricular diastolic parameters were  normal.   2. Right ventricular systolic function is normal. The right ventricular  size is normal.   3. The mitral valve is normal in structure. No evidence of mitral valve  regurgitation.   4. The aortic valve is tricuspid. Aortic valve regurgitation is not  visualized.   5. The inferior vena cava is normal in  size with greater than 50%  respiratory variability, suggesting right atrial pressure of 3 mmHg.   6. Agitated saline contrast bubble study was negative, with no evidence  of any interatrial shunt.   Past Medical History:  Diagnosis Date   Atrial fibrillation with rapid ventricular response (HCC) 02/07/2017   cardioverted in ED   Bronchitis    Chest wall pain    Dysrhythmia    A.fib and had ablation   Elevated BP without diagnosis of hypertension    Family history of adverse reaction to anesthesia    Mother gets PONV   Hyperlipidemia    Hypertension    Rhinosinusitis     Past Surgical History:  Procedure Laterality Date   arm surgery Right 2000   bone spur removal.   CARDIOVERSION  02/07/2017   COLONOSCOPY WITH PROPOFOL  N/A 01/10/2024   Procedure: COLONOSCOPY WITH PROPOFOL ;  Surgeon: Cindie Carlin POUR, DO;  Location: [facility] SUITE;  Service: Endoscopy;  Laterality: N/A;  11:15am, asa 2, pt does not want to move   CYST EXCISION     multiple cysts on abdomen, lipoma    MEDICATIONS:  ALLERGY RELIEF 180 MG tablet   Ascorbic Acid (VITAMIN C PO)   hydrocortisone  (ANUSOL -HC) 2.5 % rectal cream   magnesium 30 MG tablet   Multiple Vitamin (MULTIVITAMIN WITH  MINERALS) TABS tablet   VITAMIN D PO   No current facility-administered medications for this encounter.  Harlene Hoots Ward, PA-C [facility] Pre-Surgical Testing [phone]

## 2024-07-06 ENCOUNTER — Ambulatory Visit (HOSPITAL_COMMUNITY): Admitting: Anesthesiology

## 2024-07-06 ENCOUNTER — Ambulatory Visit (HOSPITAL_COMMUNITY)
Admission: RE | Admit: 2024-07-06 | Discharge: 2024-07-06 | Disposition: A | Discharge status: Home / Self Care | Source: Ambulatory Visit | Attending: Surgery | Admitting: Surgery

## 2024-07-06 ENCOUNTER — Other Ambulatory Visit: Payer: Self-pay

## 2024-07-06 ENCOUNTER — Encounter (HOSPITAL_COMMUNITY)
Admission: RE | Disposition: A | Discharge status: Home / Self Care | Payer: Self-pay | Source: Ambulatory Visit | Attending: Surgery

## 2024-07-06 ENCOUNTER — Ambulatory Visit (HOSPITAL_COMMUNITY): Payer: Self-pay | Admitting: Medical

## 2024-07-06 ENCOUNTER — Encounter (HOSPITAL_COMMUNITY): Payer: Self-pay | Admitting: Surgery

## 2024-07-06 DIAGNOSIS — K603 Anal fistula, unspecified: Secondary | ICD-10-CM | POA: Diagnosis not present

## 2024-07-06 DIAGNOSIS — K641 Second degree hemorrhoids: Secondary | ICD-10-CM | POA: Diagnosis not present

## 2024-07-06 DIAGNOSIS — I10 Essential (primary) hypertension: Secondary | ICD-10-CM | POA: Diagnosis not present

## 2024-07-06 DIAGNOSIS — K642 Third degree hemorrhoids: Secondary | ICD-10-CM | POA: Insufficient documentation

## 2024-07-06 DIAGNOSIS — K60319 Anal fistula, simple, unspecified: Secondary | ICD-10-CM | POA: Diagnosis not present

## 2024-07-06 DIAGNOSIS — K644 Residual hemorrhoidal skin tags: Secondary | ICD-10-CM | POA: Diagnosis not present

## 2024-07-06 DIAGNOSIS — K60312 Anal fistula, simple, persistent: Secondary | ICD-10-CM | POA: Diagnosis not present

## 2024-07-06 DIAGNOSIS — I480 Paroxysmal atrial fibrillation: Secondary | ICD-10-CM | POA: Insufficient documentation

## 2024-07-06 HISTORY — PX: HEMORRHOID SURGERY: SHX153

## 2024-07-06 HISTORY — PX: RECTAL EXAM UNDER ANESTHESIA: SHX6399

## 2024-07-06 HISTORY — PX: ANAL FISTULOTOMY: SHX6423

## 2024-07-06 SURGERY — ANAL FISTULOTOMY
Anesthesia: General | Site: Rectum

## 2024-07-06 MED ORDER — BUPIVACAINE LIPOSOME 1.3 % IJ SUSP
INTRAMUSCULAR | Status: AC
  Filled 2024-07-06: qty 20

## 2024-07-06 MED ORDER — ONDANSETRON HCL 4 MG/2ML IJ SOLN
INTRAMUSCULAR | Status: DC | PRN
  Administered 2024-07-06: 4 mg via INTRAVENOUS

## 2024-07-06 MED ORDER — OXYCODONE HCL 5 MG PO TABS: 5.0000 mg | ORAL_TABLET | Freq: Four times a day (QID) | ORAL | 0 refills | Status: AC | PRN

## 2024-07-06 MED ORDER — ONDANSETRON HCL 4 MG/2ML IJ SOLN
INTRAMUSCULAR | Status: AC
  Filled 2024-07-06: qty 2

## 2024-07-06 MED ORDER — BUPIVACAINE LIPOSOME 1.3 % IJ SUSP
20.0000 mL | Freq: Once | INTRAMUSCULAR | Status: DC
  Filled 2024-07-06: qty 20

## 2024-07-06 MED ORDER — BUPIVACAINE-EPINEPHRINE 0.5% -1:200000 IJ SOLN
INTRAMUSCULAR | Status: DC | PRN
  Administered 2024-07-06: 30 mL

## 2024-07-06 MED ORDER — OXYCODONE HCL 5 MG/5ML PO SOLN: 5.0000 mg | Freq: Once | ORAL | Status: AC | PRN

## 2024-07-06 MED ORDER — METHYLENE BLUE (ANTIDOTE) 1 % IV SOLN
INTRAVENOUS | Status: DC | PRN
  Administered 2024-07-06: 2 mL

## 2024-07-06 MED ORDER — GABAPENTIN 300 MG PO CAPS: 300.0000 mg | ORAL_CAPSULE | ORAL | Status: DC

## 2024-07-06 MED ORDER — CHLORHEXIDINE GLUCONATE CLOTH 2 % EX PADS: 6.0000 | MEDICATED_PAD | Freq: Once | CUTANEOUS | Status: DC

## 2024-07-06 MED ORDER — BUPIVACAINE LIPOSOME 1.3 % IJ SUSP
INTRAMUSCULAR | Status: DC | PRN
  Administered 2024-07-06: 20 mL

## 2024-07-06 MED ORDER — ACETAMINOPHEN 10 MG/ML IV SOLN: 1000.0000 mg | Freq: Once | INTRAVENOUS | Status: DC | PRN

## 2024-07-06 MED ORDER — BUPIVACAINE-EPINEPHRINE (PF) 0.5% -1:200000 IJ SOLN
INTRAMUSCULAR | Status: AC
  Filled 2024-07-06: qty 30

## 2024-07-06 MED ORDER — FENTANYL CITRATE PF 50 MCG/ML IJ SOSY: 25.0000 ug | PREFILLED_SYRINGE | INTRAMUSCULAR | Status: DC | PRN

## 2024-07-06 MED ORDER — ROCURONIUM BROMIDE 10 MG/ML (PF) SYRINGE
PREFILLED_SYRINGE | INTRAVENOUS | Status: DC | PRN
  Administered 2024-07-06: 40 mg via INTRAVENOUS
  Administered 2024-07-06: 10 mg via INTRAVENOUS

## 2024-07-06 MED ORDER — DEXAMETHASONE SODIUM PHOSPHATE 10 MG/ML IJ SOLN
INTRAMUSCULAR | Status: DC | PRN
  Administered 2024-07-06: 10 mg via INTRAVENOUS

## 2024-07-06 MED ORDER — LIDOCAINE 2% (20 MG/ML) 5 ML SYRINGE
INTRAMUSCULAR | Status: DC | PRN
  Administered 2024-07-06: 60 mg via INTRAVENOUS

## 2024-07-06 MED ORDER — PROPOFOL 10 MG/ML IV BOLUS
INTRAVENOUS | Status: DC | PRN
  Administered 2024-07-06: 200 mg via INTRAVENOUS

## 2024-07-06 MED ORDER — LIDOCAINE HCL (PF) 2 % IJ SOLN
INTRAMUSCULAR | Status: AC
  Filled 2024-07-06: qty 5

## 2024-07-06 MED ORDER — DEXAMETHASONE SODIUM PHOSPHATE 10 MG/ML IJ SOLN
INTRAMUSCULAR | Status: AC
  Filled 2024-07-06: qty 1

## 2024-07-06 MED ORDER — DIBUCAINE (PERIANAL) 1 % EX OINT
TOPICAL_OINTMENT | CUTANEOUS | Status: DC | PRN
  Administered 2024-07-06: 1 via RECTAL

## 2024-07-06 MED ORDER — ORAL CARE MOUTH RINSE: 15.0000 mL | Freq: Once | OROMUCOSAL | Status: AC

## 2024-07-06 MED ORDER — KETOROLAC TROMETHAMINE 30 MG/ML IJ SOLN
INTRAMUSCULAR | Status: AC
  Filled 2024-07-06: qty 1

## 2024-07-06 MED ORDER — DEXMEDETOMIDINE HCL IN NACL 80 MCG/20ML IV SOLN
INTRAVENOUS | Status: AC
  Filled 2024-07-06: qty 20

## 2024-07-06 MED ORDER — CELECOXIB 200 MG PO CAPS: 200.0000 mg | ORAL_CAPSULE | ORAL | Status: DC

## 2024-07-06 MED ORDER — SUGAMMADEX SODIUM 200 MG/2ML IV SOLN
INTRAVENOUS | Status: AC
  Filled 2024-07-06: qty 2

## 2024-07-06 MED ORDER — FENTANYL CITRATE (PF) 100 MCG/2ML IJ SOLN
INTRAMUSCULAR | Status: DC | PRN
  Administered 2024-07-06: 100 ug via INTRAVENOUS

## 2024-07-06 MED ORDER — OXYCODONE HCL 5 MG PO TABS
5.0000 mg | ORAL_TABLET | Freq: Once | ORAL | Status: AC | PRN
  Administered 2024-07-06: 5 mg via ORAL

## 2024-07-06 MED ORDER — FENTANYL CITRATE (PF) 100 MCG/2ML IJ SOLN
INTRAMUSCULAR | Status: AC
  Filled 2024-07-06: qty 2

## 2024-07-06 MED ORDER — CEFTRIAXONE SODIUM 2 G IJ SOLR
2.0000 g | INTRAMUSCULAR | Status: AC
  Administered 2024-07-06: 2 g via INTRAVENOUS
  Filled 2024-07-06: qty 20

## 2024-07-06 MED ORDER — PROPOFOL 10 MG/ML IV BOLUS
INTRAVENOUS | Status: AC
  Filled 2024-07-06: qty 20

## 2024-07-06 MED ORDER — MIDAZOLAM HCL 2 MG/2ML IJ SOLN
INTRAMUSCULAR | Status: AC
  Filled 2024-07-06: qty 2

## 2024-07-06 MED ORDER — KETOROLAC TROMETHAMINE 30 MG/ML IJ SOLN
30.0000 mg | Freq: Once | INTRAMUSCULAR | Status: AC | PRN
  Administered 2024-07-06: 30 mg via INTRAVENOUS

## 2024-07-06 MED ORDER — DEXMEDETOMIDINE HCL IN NACL 80 MCG/20ML IV SOLN
INTRAVENOUS | Status: DC | PRN
  Administered 2024-07-06 (×3): 4 ug via INTRAVENOUS

## 2024-07-06 MED ORDER — ACETAMINOPHEN 500 MG PO TABS: 1000.0000 mg | ORAL_TABLET | ORAL | Status: DC

## 2024-07-06 MED ORDER — DIBUCAINE (PERIANAL) 1 % EX OINT
TOPICAL_OINTMENT | CUTANEOUS | Status: AC
  Filled 2024-07-06: qty 28

## 2024-07-06 MED ORDER — DIAZEPAM 5 MG PO TABS: 5.0000 mg | ORAL_TABLET | Freq: Three times a day (TID) | ORAL | 1 refills | Status: AC | PRN

## 2024-07-06 MED ORDER — AMISULPRIDE (ANTIEMETIC) 5 MG/2ML IV SOLN: 10.0000 mg | Freq: Once | INTRAVENOUS | Status: DC | PRN

## 2024-07-06 MED ORDER — LACTATED RINGERS IV SOLN: INTRAVENOUS | Status: DC

## 2024-07-06 MED ORDER — CHLORHEXIDINE GLUCONATE 0.12 % MT SOLN
15.0000 mL | Freq: Once | OROMUCOSAL | Status: AC
  Administered 2024-07-06: 15 mL via OROMUCOSAL

## 2024-07-06 MED ORDER — MIDAZOLAM HCL 5 MG/5ML IJ SOLN
INTRAMUSCULAR | Status: DC | PRN
  Administered 2024-07-06: 2 mg via INTRAVENOUS

## 2024-07-06 MED ORDER — METHYLENE BLUE (ANTIDOTE) 1 % IV SOLN
INTRAVENOUS | Status: AC
  Filled 2024-07-06: qty 10

## 2024-07-06 MED ORDER — OXYCODONE HCL 5 MG PO TABS
ORAL_TABLET | ORAL | Status: AC
  Filled 2024-07-06: qty 1

## 2024-07-06 MED ORDER — ROCURONIUM BROMIDE 10 MG/ML (PF) SYRINGE
PREFILLED_SYRINGE | INTRAVENOUS | Status: AC
  Filled 2024-07-06: qty 10

## 2024-07-06 SURGICAL SUPPLY — 32 items
BAG COUNTER SPONGE SURGICOUNT (BAG) IMPLANT
BENZOIN TINCTURE PRP APPL 2/3 (GAUZE/BANDAGES/DRESSINGS) ×1 IMPLANT
BLADE SURG 15 STRL LF DISP TIS (BLADE) IMPLANT
BRIEF MESH DISP LRG (UNDERPADS AND DIAPERS) ×1 IMPLANT
CNTNR URN SCR LID CUP LEK RST (MISCELLANEOUS) ×1 IMPLANT
COVER SURGICAL LIGHT HANDLE (MISCELLANEOUS) ×1 IMPLANT
DRAPE LAPAROTOMY T 102X78X121 (DRAPES) ×1 IMPLANT
ELECT NDL BLADE 2-5/6 (NEEDLE) IMPLANT
ELECT NEEDLE BLADE 2-5/6 (NEEDLE) IMPLANT
ELECT REM PT RETURN 15FT ADLT (MISCELLANEOUS) ×1 IMPLANT
GAUZE 4X4 16PLY ~~LOC~~+RFID DBL (SPONGE) ×1 IMPLANT
GAUZE PAD ABD 8X10 STRL (GAUZE/BANDAGES/DRESSINGS) IMPLANT
GAUZE SPONGE 4X4 12PLY STRL (GAUZE/BANDAGES/DRESSINGS) IMPLANT
GLOVE ECLIPSE 8.0 STRL XLNG CF (GLOVE) ×1 IMPLANT
GLOVE INDICATOR 8.0 STRL GRN (GLOVE) ×1 IMPLANT
GOWN STRL REUS W/ TWL XL LVL3 (GOWN DISPOSABLE) ×3 IMPLANT
KIT BASIN OR (CUSTOM PROCEDURE TRAY) ×1 IMPLANT
KIT TURNOVER KIT A (KITS) ×1 IMPLANT
LOOP VESSEL MAXI BLUE (MISCELLANEOUS) IMPLANT
NDL HYPO 22X1.5 SAFETY MO (MISCELLANEOUS) ×1 IMPLANT
NEEDLE HYPO 22X1.5 SAFETY MO (MISCELLANEOUS) ×1 IMPLANT
PACK BASIC VI WITH GOWN DISP (CUSTOM PROCEDURE TRAY) ×1 IMPLANT
PENCIL BUTTON HOLSTER BLD 10FT (ELECTRODE) ×1 IMPLANT
SCRUB CHG 4% DYNA-HEX 4OZ (MISCELLANEOUS) ×1 IMPLANT
SPIKE FLUID TRANSFER (MISCELLANEOUS) ×1 IMPLANT
SURGILUBE 2OZ TUBE FLIPTOP (MISCELLANEOUS) ×1 IMPLANT
SUT CHROMIC 2 0 SH (SUTURE) ×1 IMPLANT
SUT VIC AB 2-0 UR6 27 (SUTURE) ×6 IMPLANT
SYR 20ML LL LF (SYRINGE) ×1 IMPLANT
SYR 3ML LL SCALE MARK (SYRINGE) IMPLANT
TOWEL OR 17X26 10 PK STRL BLUE (TOWEL DISPOSABLE) ×1 IMPLANT
YANKAUER SUCT BULB TIP 10FT TU (MISCELLANEOUS) ×1 IMPLANT

## 2024-07-06 NOTE — Anesthesia Postprocedure Evaluation (Signed)
 Anesthesia Post Note  Patient: Todd Ford  Procedure(s) Performed: ANAL FISTULA REPAIR (Rectum) EXAM UNDER ANESTHESIA, RECTUM (Rectum) HEMORRHOIDECTOMY (Rectum)     Patient location during evaluation: PACU Anesthesia Type: General Level of consciousness: awake Pain management: pain level controlled Vital Signs Assessment: post-procedure vital signs reviewed and stable Respiratory status: spontaneous breathing, nonlabored ventilation and respiratory function stable Cardiovascular status: blood pressure returned to baseline and stable Postop Assessment: no apparent nausea or vomiting Anesthetic complications: no   No notable events documented.  Last Vitals:  Vitals:   07/06/24 1115 07/06/24 1130  BP: 117/74 123/76  Pulse: (!) 54 62  Resp:    Temp:    SpO2: 100% 100%    Last Pain:  Vitals:   07/06/24 1002  TempSrc:   PainSc: 4                  Calum Cormier P Ayesha Markwell

## 2024-07-06 NOTE — H&P (Signed)
 07/06/2024   REFERRING PHYSICIAN: Ezzard Raring, PA  Patient Care Team: Alphonsa Glendia Sayre, MD as PCP - General (Family Medicine)  PROVIDER: [name] JUDAH SCHULTZE, MD  [facility] MRN: [id number] DOB: 11/29/1986  SUBJECTIVE   Chief Complaint: New Consultation   Todd Ford is a 38 y.o. male  who is seen today as an office consultation  at the request of DrRONITA Hasty for evaluation of perianal drainage question of fistula.  History of Present Illness:  38 year old male. 10-year history of rectal pain and itching. Felt a marblelike sensation back there with pain and discomfort. Feels rectal pain if he gets erections. Some occasional puslike drainage. Underwent colonoscopy 01/09/2022 for rectal bleeding. Colon and ileum normal. Some mild internal hemorrhoids noted. Some external hemorrhoids as well. Recurrent episodes of intermittent purulent drainage. Fistula suspected. MRI done which showed question of incomplete perirectal tract but not inter/transsphincteric. Some concern of colitis/proctitis. Surgical consultation offered.  Patient comes in by himself. He claims he moves his bowels about once a day. No bouts of constipation or diarrhea. He does feel some burning and pressure and bleeding. Feels like he pushes across the lump if his stools little harder. Can be painful. Wakes him up every other month and drainage lasted several days. Calms down but comes back. Does not smoke. No sleep apnea. No diabetes. He had a sebaceous cyst removed in his right lower abdomen but no other subcutaneous issues. No right prior thrombosed hemorrhoid lancing or drainage of a perirectal abscess. No personal family history of inflammatory bowel disease or Crohn's. Can walk several miles without difficulty. Gets up to urinate maybe once a night but no difficulty starting or maintaining stream. No prostatism.  Medical History:  History reviewed. No pertinent past medical history.  Patient Active Problem List   Diagnosis  Anal fistula  Prolapsed internal hemorrhoids, grade 2  History of rectal bleeding   Past Surgical History:  Procedure Laterality Date  Arm surgery  cyst remvoal    Allergies  Allergen Reactions  Neomycin-Polymyxin-Gramicidin Other (See Comments)  Wound never heals it gets infected  Penicillins Rash   Current Outpatient Medications on File Prior to Visit  Medication Sig Dispense Refill  ascorbic acid (VITA-C ORAL) Take by mouth  cholecalciferol 1000 unit tablet Take by mouth  loratadine (CLARITIN) 10 mg tablet Take 10 mg by mouth once daily  multivitamin tablet Take 1 tablet by mouth once daily   No current facility-administered medications on file prior to visit.   History reviewed. No pertinent family history.   Social History   Tobacco Use  Smoking Status Never  Smokeless Tobacco Never    Social History   Socioeconomic History  Marital status: Married  Tobacco Use  Smoking status: Never  Smokeless tobacco: Never  Substance and Sexual Activity  Alcohol use: Never  Drug use: Never   Social Drivers of Health   Housing Stability: Unknown (03/26/2024)  Housing Stability Vital Sign  Homeless in the Last Year: No   ############################################################  Review of Systems: A complete review of systems (ROS) was obtained from the patient.  We have reviewed this information and discussed as appropriate with the patient.  See HPI as well for other pertinent ROS.  Constitutional: No fevers, chills, sweats. Weight stable Eyes: No vision changes, No discharge HENT: No sore throats, nasal drainage Lymph: No neck swelling, No bruising easily Pulmonary: No cough, productive sputum CV: No orthopnea, PND . No exertional chest/neck/shoulder/arm pain. Patient can walk >5 miles without difficulty.   GI:  No personal nor family history of GI/colon cancer, inflammatory bowel disease, irritable bowel syndrome, allergy such as Celiac Sprue,  dietary/dairy problems, colitis, ulcers nor gastritis. No recent sick contacts/gastroenteritis. No travel outside the country. No changes in diet.  Renal: No UTIs, No hematuria Genital: No drainage, bleeding, masses Musculoskeletal: No severe joint pain. Good ROM major joints Skin: No sores or lesions Heme/Lymph: No easy bleeding. No swollen lymph nodes Neuro: No active seizures. No facial droop Psych: No hallucinations. No agitation  OBJECTIVE   Vitals:  03/26/24 1521  BP: (!) 154/77  Pulse: 72  Temp: 37.3 C (99.2 F)  SpO2: 99%  Weight: (!) 105.7 kg (233 lb)  Height: 200.7 cm (6' 7)  PainSc: 0-No pain  PainLoc: Rectum   Body mass index is 26.25 kg/m.  PHYSICAL EXAM:  Constitutional: Not cachectic. Hygeine adequate. Vitals signs as above.  Eyes: No glasses. Vision adequate,Pupils reactive, normal extraocular movements. Sclera nonicteric Neuro: CN II-XII intact. No major focal sensory defects. No major motor deficits. Lymph: No head/neck/groin lymphadenopathy Psych: No severe agitation. No severe anxiety. Judgment & insight Adequate, Oriented x4, HENT: Normocephalic, Mucus membranes moist. No thrush. Hearing: adequate Neck: Supple, No tracheal deviation. No obvious thyromegaly Chest: No pain to chest wall compression. Good respiratory excursion. No audible wheezing CV: Pulses intact. regular. No major extremity edema Ext: No obvious deformity or contracture. Edema: Not present. No cyanosis Skin: No major subcutaneous nodules. Warm and dry Musculoskeletal: Severe joint rigidity not present. No obvious clubbing. No digital petechiae. Mobility: no assist device moving easily without restrictions  Abdomen: Flat Soft. Nondistended. Nontender. Hernia: Not present. Diastasis recti: Not present. No hepatomegaly. No splenomegaly.  Genital/Pelvic: Inguinal hernia: Not present. Inguinal lymph nodes: without lymphadenopathy nor hidradenitis.   Rectal:  Perianal skin Clean with  good hygiene  Pruritis ani: Mild Pilonidal disease: Not present  External hemorrhoids: Not present Condyloma / warts: Not present Anal fissure: Not present Perirectal abscess/fistula: Right anterior subcutaneous cord could be felt from right anterior anal verge towards right anterior perirectal region. Cannot feel any definite sinus opening. Sphincter tone: Normal but some spasming.  Digital and anoscopic rectal exam: Barely tolerated  Hemorrhoidal piles: Grade 2 internal at left posterior lateral with some inflammation. Right posterior grade 1. Right anterior grade 2. Prostate: Normal size & no nodularity Rectovaginal septum: N/A Rectal masses: Not present  Other significant findings: No strong evidence of any anal cryptitis, anal crypt polyp, internal opening.  Patient examined with patient in decubitus position .  ###################################    ###################################################################  Labs, Imaging and Diagnostic Testing:  Located in 'Care Everywhere' section of Epic EMR chart  PRIOR [facility] CLINIC NOTES:  Not applicable  SURGERY NOTES:  Not applicable  PATHOLOGY:  Not applicable  Assessment and Plan:  DIAGNOSES:  Diagnoses and all orders for this visit:  Anal fistula  Prolapsed internal hemorrhoids, grade 2  History of rectal bleeding    ASSESSMENT/PLAN  Intermittent anorectal pain and drainage and bleeding for 10 years without any other obvious source. Exam and MRI concerning for anorectal fistula although possibly incomplete.  Because he has had symptoms intermittently for the past 10 years without any other good explanation, I think he would benefit from anorectal examination under esthesia. Probable excision of fistula tract. More aggressive intervention to rule out intersphincteric or transsphincteric fistula. Given opportunity to do hemorrhoidal ligation & pexy. Outpatient surgery. He feels like he has been tortured  with this for 10 years and he would like to be aggressive  and agrees with outpatient surgery in the hopes of fixing the problem The anatomy & physiology of the anorectal region was discussed. We discussed the pathophysiology of anorectal abscess and fistula. Differential diagnosis was discussed. Natural history progression was discussed. I stressed the importance of a bowel regimen to have daily soft bowel movements to minimize progression of disease.   The patient's condition is not adequately controlled. Non-operative treatment has not healed the fistula. Therefore, I recommended examination under anaesthesia to confirm the diagnosis and treat the fistula. I discussed techniques that may be required such as fistulotomy, ligation by LIFT technique, and/or seton placement. Benefits & alternatives discussed. I noted a good likelihood this will help address the problem, but sometimes repeat operations and prolonged healing times may occur. Risks such as bleeding, pain, recurrence, reoperation, injury to other organs, need for repair of tissues / organs reoperation, incontinence, heart attack, death, and other risks were discussed.   Educational handouts further explaining the pathology, treatment options, and bowel regimen were given. The patient expressed understanding & wishes to proceed. We will work to coordinate surgery for a mutually convenient time.    [name] KYM Schultze, MD, FACS, MASCRS Esophageal, Gastrointestinal & Colorectal Surgery Robotic and Minimally Invasive Surgery  [facility] [address], [address] [place], [place] [address] [phone] Fax [phone] Main  CONTACT INFORMATION: Weekday (9AM-5PM): Call [facility] main office at [phone] Weeknight (5PM-9AM) or Weekend/Holiday: Check EPIC Web Links tab & use AMION (password  [email or web address]) for General Surgery [facility] coverage  Please, DO NOT use SecureChat  (it is not reliable  communication to reach operating surgeons & will lead to a delay in care).   Epic staff messaging available for outptient concerns needing 1-2 business day response.     07/06/2024

## 2024-07-06 NOTE — Op Note (Signed)
 07/06/2024  8:58 AM  PATIENT:  Todd Ford  38 y.o. male  Patient Care Team: Alphonsa Glendia LABOR, MD as PCP - General (Family Medicine) Mealor, Eulas BRAVO, MD as PCP - Electrophysiology (Cardiology) Sheldon Standing, MD as Consulting Physician (General Surgery) Cindie Carlin POUR, DO as Consulting Physician (Gastroenterology)  PRE-OPERATIVE DIAGNOSIS:   ANAL FISTULA - INTERSPHINCTERIC HEMORRHOID - INTERNAL GRADE 3 PROLAPSING,  HEMORRHOID - INTERNAL GRADE 2 PROLAPSING  POST-OPERATIVE DIAGNOSIS:   ANAL FISTULA - INTERSPHINCTERIC (incomplete) HEMORRHOID - INTERNAL GRADE 3 PROLAPSING HEMORRHOID - INTERNAL GRADE 2 PROLAPSING  PROCEDURE:  ANAL FISTULA REPAIR - LIFT (Ligation of Intersphincteric fistula tract) HEMORRHOIDAL LIGATION & HEMORRHOIDOPEXY HEMORRHOIDECTOMY x 1 ANORECTAL EXAMINATION UNDER ANESTHESIA  SURGEON:  Standing KYM Sheldon, MD  ASSISTANT:  (n/a)   ANESTHESIA:  General endotracheal intubation anesthesia (GETA) and Anorectal and field block for perioperative & postoperative pain control provided with liposomal bupivacaine  (Experel) 20mL mixed with 30mL of bupivicaine 0.25% with epinephrine   Estimated Blood Loss (EBL):   Total I/O In: 100 [IV Piggyback:100] Out: - .   (See anesthesia record)  Delay start of Pharmacological VTE agent (>24hrs) due to concerns of significant anemia, surgical blood loss, or risk of bleeding?:  no  DRAINS: (None)  SPECIMEN:  Anal fistula tract and Hemorrhoid: Right lateral  DISPOSITION OF SPECIMEN:  Pathology  COUNTS:  Sponge, needle, & instrument counts CORRECT at the conclusion of the case.      PLAN OF CARE: Discharge to home after PACU  PATIENT DISPOSITION:  PACU - hemodynamically stable.  INDICATION: Patient with probable perirectal fistula.  I recommended examination and surgical treatment:  The anatomy & physiology of the anorectal region was discussed.  We discussed the pathophysiology of anorectal abscess and fistula.   Differential diagnosis was discussed.  Natural history progression was discussed.   I stressed the importance of a bowel regimen to have daily soft bowel movements to minimize progression of disease.     The patient's condition is not adequately controlled.  Non-operative treatment has not healed the fistula.  Therefore, I recommended examination under anaesthesia to confirm the diagnosis and treat the fistula.  I discussed techniques that may be required such as fistulotomy, ligation by LIFT technique, and/or seton placement.  Benefits & alternatives discussed.  I noted a good likelihood this will help address the problem, but sometimes repeat operations and prolonged healing times may occur.  Risks such as bleeding, pain, recurrence, reoperation, incontinence, heart attack, death, and other risks were discussed.      Educational handouts further explaining the pathology, treatment options, and bowel regimen were given.  The patient expressed understanding & wishes to proceed.  We will work to coordinate surgery for a mutually convenient time.   OR FINDINGS:  Patient had an incomplete right lateral intersphincteric fistula.    External location RIGHT LATERAL  about 2.5 cm from anal verge.  Internal location : Right posteriolateral anal canal about 2 cm from anal verge  Grade 3 right lateral hemorrhoid.  Ligation pexy and partial hemorrhoidectomy done.  Other piles grade 2.  Ligation/pexy only done.  DESCRIPTION:   Informed consent was confirmed. Patient underwent general anesthesia without difficulty. Patient was placed into prone/jacknife positioning.  The perianal region was prepped and draped in sterile fashion. Surgical timeout confirmed or plan.  I did digital rectal examination and then transitioned over to anoscopy to get a sense of the anatomy.  Findings as noted above.  Clearly had scarring and divot ink in the right  lateral perirectal region.  Can place it on axial tension and feel a  cord heading towards the sphincter complex.  The tract did not feel superficial, concerning for a probable intersphincteric fistula.  No abscess located.  I went ahead and proceeded with the LIFT technique.  I started externally to excise the external opening with a radial biconcave incision around it.  I gradually dissected around the chronic fistulous tract and headed proximally towards the sphincter component & confirm suspicions of sphincter involvement.  It was quite narrow and not able to be safely probed toward or injected but clearly was going through the sphincter complex with a cord continuing in the right posterior anal canal.  Anorectal margin with some diffident but no major chronic sinus.     I then focused on the internal component using Parks retractor alternating with a large Marketing executive.  Placed a 2-0 Vicryl suture 6 cm proximal anal verge over the internal opening.  Did interrupted running figure-of-eight sutures do hemorrhoidal ligation and pexy.  Came down close to the internal opening.  I made a longitudinal biconcave incision of distal rectal mucosa and anal canal around the internal opening.  I did this to carefully identify the sphincter complex.  I carefully went between the internal and external sphincter using careful blunt dissection parallel to the fibers.  I placed 2-0 Vicryl stitches through the inner sphincteric tract on the proximal side and on the distal side in between the external & internal sphincters.  I ligated the stumps of the transected segments with 2-0 Vicryl again with a figure-of-eight stitch in a 90 degree fashion to doubly ligate and prove that the tract had been closed.  I trimmed out redundant internal hemorrhoidal tissue longitudinally for a right lateral hemorrhoidectomy.  I then ran the right lateral rectal longitudinal 2-0 Vicryl stitch down to the anal verge to also help cover up the intersphincteric ligation wound as well.  I tied that  running suture down over the anal retractor, thus closing the internal opening and protecting the LIFT repair without causing any luminal narrowing.  I focused on the hemorrhoidal piles. I used a 2-0 Vicryl suture on a UR-6 needle in a figure-of-eight fashion 6 cm proximal to the anal verge.  Focused on the right posterior pile which was at least grade 2.  I then ran the running interrupted 2-0 Vicryl suture longitudinally along that hemorrhoidal column more distally to close the hemorrhoidectomy wound to the anal verge over a large Parks self-retaining anal retractor to avoid narrowing of the anal canal.  I then tied that stitch down to cause a hemorrhoidopexy.  I then did hemorrhoidal ligation and pexy at the other 4 hemorrhoidal columns.  At the completion of this, all 6 anorectal columns were ligated and pexied in the classic hexagonal fashion (right anterior/lateral/posterior, left anterior/lateral/posterior).    I excised right lateral perirectal tissue to have a broad open flat wound.  Because the wound was deep I did place some figure-of-eight absorbable 2-0 chromic sutures at the base to help close the base down to make it more superficial.   20 x 15 mm right lateral external wound about 5 mm deep.  I reexamined the anal canal.   There is was no narrowing.  Hemostasis was excellent.  I repeated anoscopy and examination.  Hemostasis was good.  We placed fluff gauze to onlay over the wounds.  No packing done.    I discussed operative plans and postop recovery her condition  of the patient's in the holding area.  Instructions written and prescription sent.  I discussed operative findings, updated the patient's status, discussed probable steps to recovery, and gave postoperative recommendations to the patient's spouse, Starling Christofferson.  Recommendations were made.  Questions were answered.  She expressed understanding & appreciation.  [name] KYM Schultze, M.D., F.A.C.S. Gastrointestinal and Minimally  Invasive Surgery [facility] [address] [place], [place] [address] [phone] Main / Paging

## 2024-07-06 NOTE — Anesthesia Procedure Notes (Signed)
 Procedure Name: Intubation Date/Time: 07/06/2024 7:35 AM  Performed by: Valree Feild D, CRNAPre-anesthesia Checklist: Patient identified, Emergency Drugs available, Suction available and Patient being monitored Patient Re-evaluated:Patient Re-evaluated prior to induction Oxygen Delivery Method: Circle system utilized Preoxygenation: Pre-oxygenation with 100% oxygen Induction Type: IV induction Ventilation: Mask ventilation without difficulty Laryngoscope Size: Mac and 4 Grade View: Grade I Tube type: Oral Tube size: 7.5 mm Number of attempts: 1 Airway Equipment and Method: Stylet and Oral airway Placement Confirmation: ETT inserted through vocal cords under direct vision, positive ETCO2 and breath sounds checked- equal and bilateral Secured at: 22 cm Tube secured with: Tape Dental Injury: Teeth and Oropharynx as per pre-operative assessment

## 2024-07-06 NOTE — Interval H&P Note (Signed)
 History and Physical Interval Note:  07/06/2024 7:01 AM  Todd Ford  has presented today for surgery, with the diagnosis of PERIRECTAL FISTULA.  The various methods of treatment have been discussed with the patient and family. After consideration of risks, benefits and other options for treatment, the patient has consented to  Procedure(s) with comments: ANAL FISTULOTOMY (N/A) - REPAIR OF PERIRECTAL FISTULA, POSSIBLE HEMORRHOIDECTOMY EXAM UNDER ANESTHESIA, RECTUM (N/A) HEMORRHOIDECTOMY (N/A) as a surgical intervention.  The patient's history has been reviewed, patient examined, no change in status, stable for surgery.  I have reviewed the patient's chart and labs.  Questions were answered to the patient's satisfaction.    I have re-reviewed the the patient's records, history, medications, and allergies.  I have re-examined the patient.  I again discussed intraoperative plans and goals of post-operative recovery.  The patient agrees to proceed.  Todd Ford  November 07, 1986 [id number]  Patient Care Team: Alphonsa Glendia LABOR, MD as PCP - General (Family Medicine) Mealor, Eulas BRAVO, MD as PCP - Electrophysiology (Cardiology) Sheldon Standing, MD as Consulting Physician (General Surgery) Cindie Carlin POUR, DO as Consulting Physician (Gastroenterology)  Patient Active Problem List   Diagnosis Date Noted   Rectal pain 02/21/2024   Rectal discharge 02/21/2024   RUQ pain 02/09/2024   Paroxysmal atrial fibrillation (HCC) 09/13/2022    Past Medical History:  Diagnosis Date   Atrial fibrillation with rapid ventricular response (HCC) 02/07/2017   cardioverted in ED   Bronchitis    Chest wall pain    Dysrhythmia    A.fib and had ablation   Elevated BP without diagnosis of hypertension    Family history of adverse reaction to anesthesia    Mother gets PONV   Hyperlipidemia    Hypertension    Rhinosinusitis     Past Surgical History:  Procedure Laterality Date   arm surgery Right 2000    bone spur removal.   CARDIOVERSION  02/07/2017   COLONOSCOPY WITH PROPOFOL  N/A 01/10/2024   Procedure: COLONOSCOPY WITH PROPOFOL ;  Surgeon: Cindie Carlin POUR, DO;  Location: [facility];  Service: Endoscopy;  Laterality: N/A;  11:15am, asa 2, pt does not want to move   CYST EXCISION     multiple cysts on abdomen, lipoma    Social History   Socioeconomic History   Marital status: Single    Spouse name: Not on file   Number of children: 0   Years of education: Not on file   Highest education level: Not on file  Occupational History   Occupation: electrician  Tobacco Use   Smoking status: Never   Smokeless tobacco: Never   Tobacco comments:    Never smoke 09/13/22  Vaping Use   Vaping status: Never Used  Substance and Sexual Activity   Alcohol use: Yes    Alcohol/week: 1.0 standard drink of alcohol    Types: 1 Standard drinks or equivalent per week    Comment: 1 drink weekly 09/13/22   Drug use: No   Sexual activity: Yes  Other Topics Concern   Not on file  Social History Narrative   Not on file   Social Drivers of Health   Financial Resource Strain: Not on file  Food Insecurity: Not on file  Transportation Needs: Not on file  Physical Activity: Not on file  Stress: Not on file  Social Connections: Not on file  Intimate Partner Violence: Not on file    Family History  Problem Relation Age of Onset   Lung cancer Paternal  Grandfather     Medications Prior to Admission  Medication Sig Dispense Refill Last Dose/Taking   ALLERGY RELIEF 180 MG tablet Take 180 mg by mouth daily.   Past Week   Ascorbic Acid (VITAMIN C PO) Take 1 tablet by mouth daily.   06/29/2024   magnesium 30 MG tablet Take 30 mg by mouth 2 (two) times daily.   06/29/2024   Multiple Vitamin (MULTIVITAMIN WITH MINERALS) TABS tablet Take 1 tablet by mouth daily.   06/29/2024   VITAMIN D PO Take 1 tablet by mouth daily. One daily   06/29/2024   hydrocortisone  (ANUSOL -HC) 2.5 % rectal cream Place 1  Application rectally 2 (two) times daily. (Patient not taking: Reported on 06/26/2024) 30 g 0 Not Taking    Current Facility-Administered Medications  Medication Dose Route Frequency Provider Last Rate Last Admin   lactated ringers  infusion   Intravenous Continuous Epifanio Charleston, MD 10 mL/hr at 07/06/24 0641 New Bag at 07/06/24 0641     Allergies  Allergen Reactions   Neosporin [Neomycin-Polymyxin-Gramicidin]     Wound never heals it gets infected   Penicillins Rash    BP 130/79   Pulse 61   Temp 98 F (36.7 C) (Oral)   Resp 15   Ht 6' 7 (2.007 m)   Wt 95.3 kg   SpO2 97%   BMI 23.66 kg/m   Labs: No results found for this or any previous visit (from the past 48 hours).  Imaging / Studies: No results found.   Briant KYM Schultze, M.D., F.A.C.S. Gastrointestinal and Minimally Invasive Surgery [facility] [address] [place], [place] [address] [phone] Main / Paging  07/06/2024 7:01 AM    [name]

## 2024-07-06 NOTE — Anesthesia Preprocedure Evaluation (Addendum)
 Anesthesia Evaluation  Patient identified by MRN, date of birth, ID band Patient awake    Reviewed: Allergy & Precautions, NPO status , Patient's Chart, lab work & pertinent test results  Airway Mallampati: I  TM Distance: >3 FB Neck ROM: Full    Dental no notable dental hx.    Pulmonary neg pulmonary ROS   Pulmonary exam normal        Cardiovascular hypertension, Normal cardiovascular exam+ dysrhythmias Atrial Fibrillation      Neuro/Psych negative neurological ROS  negative psych ROS   GI/Hepatic negative GI ROS, Neg liver ROS,,,  Endo/Other  negative endocrine ROS    Renal/GU negative Renal ROS     Musculoskeletal negative musculoskeletal ROS (+)    Abdominal   Peds  Hematology negative hematology ROS (+)   Anesthesia Other Findings PERIRECTAL FISTULA  Reproductive/Obstetrics                              Anesthesia Physical Anesthesia Plan  ASA: 2  Anesthesia Plan: General   Post-op Pain Management:    Induction: Intravenous  PONV Risk Score and Plan: 2 and Ondansetron , Dexamethasone , Midazolam  and Treatment may vary due to age or medical condition  Airway Management Planned: Oral ETT  Additional Equipment:   Intra-op Plan:   Post-operative Plan: Extubation in OR  Informed Consent: I have reviewed the patients History and Physical, chart, labs and discussed the procedure including the risks, benefits and alternatives for the proposed anesthesia with the patient or authorized representative who has indicated his/her understanding and acceptance.     Dental advisory given  Plan Discussed with: CRNA  Anesthesia Plan Comments:          Anesthesia Quick Evaluation

## 2024-07-06 NOTE — Discharge Instructions (Signed)
 ##############################################  ANORECTAL SURGERY:  POST OPERATIVE INSTRUCTIONS  ######################################################################  EAT Start with a pureed / full liquid diet After 24 hours, gradually transition to a high fiber diet.    CONTROL PAIN Control pain so you can tolerate bowel movements,  walk, sleep, tolerate sneezing/coughing, and go up/down stairs.   HAVE A BOWEL MOVEMENT DAILY Keep your bowels regular to avoid problems.   Taking a fiber supplement 2-4 doses every day to keep bowels soft.   HAVE A BM BY THE SECOND POSTOPERATIVE DAY Use an antidairrheal to slow down diarrhea.   Call if not better after 2 tries  WALK Walk an hour a day.  Control your pain to do that.   CALL IF YOU HAVE PROBLEMS/CONCERNS Call if you are still struggling despite following these instructions. Call if you have concerns not answered by these instructions  ######################################################################    Take your usually prescribed home medications unless otherwise directed. Blood thinners:  You can restart any strong blood thinners after the second postoperative day  for example: COUMADIN (warfarin), XERELTO (rivaroxaban), ELIQUIS (apixaban), PLAVIX (clopidigrel), BRILINTA (ticagrelor), EFFIENT (prasugrel), PRADAXA (dabigatran), etc  Continue aspirin before & after surgery..     Some oozing/bleeding the first 1-2 weeks is common but should taper down & be small volume.    If you are passing many large clots or having uncontrolling bleeding, call your surgeon  DIET: Follow a light bland diet & liquids the first 24 hours after arrival home, such as soup, liquids, starches, etc.  Be sure to drink plenty of fluids.  Quickly advance to a usual solid diet within a few days.  Avoid fast food or heavy meals as your are more likely to get nauseated or have irregular bowels.  A low-fat, high-fiber diet for the rest of your life  is ideal.  Control pain to avoid problems with urinating or defecating:  Pain is best controlled by a usual combination of many methods TOGETHER: Warm baths/soaks or Ice packs Over the counter pain medication Prescription pain medications Topical creams  A.  Warm water baths or ice packs (30-60 minutes up to 8 times a day, especially after bowel meovements) will help.  Using ice for the first few days can help decrease swelling and bruising,  Use heat such as warm towels, sitz baths, warm baths, warm showers, etc to help relax tight/sore spots and speed recovery.   Some people prefer to use ice alone, heat alone, alternating between ice & heat.  Experiment to what works for you.    It is helpful to take an over-the-counter pain medication continuously for the first few weeks.  This helps people need to use less narcotics Choose one of the following that works best for you: Naproxen (Aleve, etc)  Two 220mg  tabs twice a day Ibuprofen (Advil, etc) Three 200mg  tabs four times a day (every meal & bedtime) Acetaminophen (Tylenol, etc) 500-650mg  four times a day (every meal & bedtime)  A  prescription for pain medication (such as oxycodone, hydrocodone, etc) should be given to you upon discharge.  Take your pain medication as prescribed.  If you are having problems/concerns with the prescription medicine (does not control pain, nausea, vomiting, rash, itching, etc), please call us [phone] to see if we need to switch you to a different pain medicine that will work better for you and/or control your side effect better. Most people will need 1-2 refills to get through the first couple of weeks when pain is the most  intense.  If you need a refill on your pain medication, please contact your pharmacy.  They will contact our office to request authorization. Prescriptions will not be filled after 5 pm or on week-ends.  If can take up to 48 hours for it to be filled & ready so avoid waiting until you  are down to thel ast pill.  A numbing topical cream (Dibucaine) may be given to you.  Many people find relief with topical creams.  Some people find it burns too much.  Experiment.  If it helps, use it.  If it burns, stop using it.  You also may receive a prescription for diazepam, a muscle relaxant to help you to be able to urinate at first easily.  It is safe to take a few doses with the other medications as long as you are not planning to drive or do anything intense.  Hopefully this can minimize the chance of needing a Foley catheter into your bladder     Have a bowel movement every day  Until you have a bowel movement after surgery, he will struggle with urinating and controlling her pain.  Take a fiber supplement (such as Metamucil, Citrucel, FiberCon, MiraLax, etc) 2-4 doses a day to help prevent constipation from occurring.     If you have not had a bowel movement the second postoperative day:  -switch to drinking liquids only -take MiraLAX double dose every 2 hours x 3 or until you have a BM -If that does not work x 3 doses, increase to 4 doses every 2 hours (Like a small bowel prep) until you have a bowel movement. -Avoid enemas or suppositories (can harm sutures/repair) -Once you start having bowel movements, adjust your fiber supplement or Miralax dosing back down until you are having 1-2 soft well formed BMs a day.  (Start at 2 doses a day & adjust up or down to avoid diarrhea or recurrent constipation)   Watch out for diarrhea.  If you have many loose bowel movements, simplify your diet to bland foods & liquids for a few days.  Stop any stool softeners and decrease your fiber supplement.  Switching to mild anti-diarrheal medications (Kayopectate, Pepto Bismol) can help.  Can try an imodium/loperamide dose.  If this worsens or does not improve, please call us.  Wound Care   a. You have some fluffed cotton on top of the anus to help catch drainage and bleeding.  THERE IS NO  PACKING INSIDE THE RECTUM -Let the cotton fall off with the first bowel movement or shower.  It is okay to reinforce or replace as needed.  Bleeding is common at first and gradually slows down after a few weeks   b. Place soft cotton balls on the anus/wounds and use an absorbent pad in your underwear as needed to catch any drainage and help keep the area.  Try to use cotton balls or pads (regular gauze or toilet paper will stick and pull, causing pain.  Cotton will come off more easily).  OK to try dry powders such as cornstarch or baby powders   c. Keep the area clean and dry.  Bathe / shower every day.  Keep the area clean by showering / bathing over the incision / wound.   It is okay to soak an open wound to help wash it.  Consider using a squeeze bottle filled with warm water to gently wash the anal area.  Wet wipes or showers / gentle washing after bowel movements  is often less traumatic than regular toilet paper.           D.  Use a Sitz Bath 4-8 times a day for relief  A sitz bath is a warm water bath taken in the sitting position that covers only the hips and buttocks. It may be used for either healing or hygiene purposes. Sitz baths are also used to relieve pain, itching, or muscle spasms.  Gently cleaned the area and the heat will help lower spasm and offer better pain control.    Fill the bathtub half full with warm water. Sit in the water and open the drain a little. Turn on the warm water to keep the tub half full. Keep the water running constantly. Soak in the water for 15 to 20 minutes. After the sitz bath, pat the affected area dry first.   d. You will often notice bleeding, especially with bowel movements.  This should slow down by the end of the first week of surgery.  It can take over a month to more fully stop.  Sitting on an ice pack can help.   e. Expect some drainage.  You often will have some blood or yellow drainage with open wounds.  Sometimes she will get a little leaking  of liquid stool until the incision/wounds have fully close down.  This should slow down by the end of the first week of surgery, but you will have occasional bleeding or drainage up to a few months after surgery until all incisions & wounds have closed.  Wear an absorbent pad or soft cotton gauze in your underwear until the drainage stops.  ACTIVITIES as tolerated:    You may resume regular (light) daily activities beginning the next day--such as daily self-care, walking, climbing stairs--gradually increasing activities as tolerated.  If you can walk 30 minutes without difficulty, it is safe to try more intense activity such as jogging, treadmill, bicycling, low-impact aerobics, swimming, etc. Save the most intensive and strenuous activity for last such as sit-ups, heavy lifting, contact sports, etc  Refrain from any heavy lifting or straining until you are off narcotics for pain control.   DO NOT PUSH THROUGH PAIN.  Let pain be your guide: If it hurts to do something, don't do it.  Pain is your body warning you to avoid that activity for another week until the pain goes down. You may drive when you are no longer taking prescription pain medication, you can comfortably sit for long periods of time, and you can safely maneuver your car and apply brakes. You may have sexual intercourse when it is comfortable.   6.  FOLLOW UP in our office Please call [facility] at [phone] to set up an appointment to see your surgeon in the office for a follow-up appointment approximately 3 weeks after your surgery. If tissue is sent for pathology, it usually takes a week for pathology results to come back.  We will make an effort to call you with results as soon as we get them.  If you have not heard back in a week and wish to know the results before your postop visit, please call our office and we will see if we can give feedback on the results Make sure that you call for this appointment the day you arrive home to  ensure a convenient appointment time.  7. IF YOU HAVE DISABILITY OR FAMILY LEAVE FORMS, BRING THEM TO THE OFFICE FOR PROCESSING.  DO NOT GIVE THEM TO YOUR  DOCTOR.        WHEN TO CALL us [phone]: Poor pain control Reactions / problems with new medications (rash/itching, nausea, etc)  Fever over 101.5 F (38.5 C) Inability to urinate Nausea and/or vomiting Worsening swelling or bruising Continued bleeding from incision. Increased pain, redness, or drainage from the incision  The clinic staff is available to answer your questions during regular business hours (8:30am-5pm).  Please don't hesitate to call and ask to speak to one of our nurses for clinical concerns.   A surgeon from [facility] is always on call at the hospitals   If you have a medical emergency, go to the nearest emergency room or call 911.    [facility] [address], [place], [place]  [address] ? MAIN: [phone] ? TOLL FREE: [phone] ? FAX [phone] [email or web address]  #####################################################

## 2024-07-06 NOTE — Transfer of Care (Signed)
 Immediate Anesthesia Transfer of Care Note  Patient: Todd Ford  Procedure(s) Performed: ANAL FISTULA REPAIR (Rectum) EXAM UNDER ANESTHESIA, RECTUM (Rectum) HEMORRHOIDECTOMY (Rectum)  Patient Location: PACU  Anesthesia Type:General  Level of Consciousness: awake, alert , and oriented  Airway & Oxygen Therapy: Patient Spontanous Breathing and Patient connected to face mask oxygen  Post-op Assessment: Report given to RN and Post -op Vital signs reviewed and stable  Post vital signs: Reviewed and stable  Last Vitals:  Vitals Value Taken Time  BP 122/63 07/06/24 09:00  Temp    Pulse 52 07/06/24 09:02  Resp 17 07/06/24 09:02  SpO2 100 % 07/06/24 09:02  Vitals shown include unfiled device data.  Last Pain:  Vitals:   07/06/24 0634  TempSrc:   PainSc: 0-No pain         Complications: No notable events documented.

## 2024-07-07 ENCOUNTER — Encounter (HOSPITAL_COMMUNITY): Payer: Self-pay | Admitting: Surgery

## 2024-07-09 LAB — SURGICAL PATHOLOGY

## 2024-11-30 ENCOUNTER — Ambulatory Visit: Payer: Self-pay | Admitting: Gastroenterology

## 2025-01-17 ENCOUNTER — Ambulatory Visit: Admitting: Internal Medicine
